# Patient Record
Sex: Female | Born: 1995 | Race: Black or African American | Hispanic: No | Marital: Single | Smoking: Never smoker
Health system: Southern US, Community
[De-identification: ages and names within clinical notes are randomized; demographics above are authoritative.]

## PROBLEM LIST (undated history)

## (undated) ENCOUNTER — Inpatient Hospital Stay (HOSPITAL_COMMUNITY): Payer: Self-pay

## (undated) DIAGNOSIS — F209 Schizophrenia, unspecified: Secondary | ICD-10-CM

## (undated) DIAGNOSIS — Z789 Other specified health status: Secondary | ICD-10-CM

## (undated) HISTORY — PX: TONSILLECTOMY: SUR1361

## (undated) HISTORY — PX: ADENOIDECTOMY: SUR15

---
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
## 2015-07-30 ENCOUNTER — Encounter (HOSPITAL_COMMUNITY): Payer: Self-pay | Admitting: *Deleted

## 2015-07-30 ENCOUNTER — Emergency Department (HOSPITAL_COMMUNITY)
Admission: EM | Admit: 2015-07-30 | Discharge: 2015-07-31 | Disposition: A | Discharge status: Home / Self Care | Payer: Medicaid Other | Attending: Emergency Medicine | Admitting: Emergency Medicine

## 2015-07-30 DIAGNOSIS — R630 Anorexia: Secondary | ICD-10-CM | POA: Insufficient documentation

## 2015-07-30 DIAGNOSIS — N760 Acute vaginitis: Secondary | ICD-10-CM | POA: Diagnosis not present

## 2015-07-30 DIAGNOSIS — F17200 Nicotine dependence, unspecified, uncomplicated: Secondary | ICD-10-CM | POA: Diagnosis not present

## 2015-07-30 DIAGNOSIS — J020 Streptococcal pharyngitis: Secondary | ICD-10-CM | POA: Diagnosis not present

## 2015-07-30 DIAGNOSIS — Z331 Pregnant state, incidental: Secondary | ICD-10-CM | POA: Diagnosis not present

## 2015-07-30 DIAGNOSIS — B9689 Other specified bacterial agents as the cause of diseases classified elsewhere: Secondary | ICD-10-CM

## 2015-07-30 DIAGNOSIS — Z3490 Encounter for supervision of normal pregnancy, unspecified, unspecified trimester: Secondary | ICD-10-CM

## 2015-07-30 DIAGNOSIS — R109 Unspecified abdominal pain: Secondary | ICD-10-CM | POA: Diagnosis present

## 2015-07-30 MED ORDER — SODIUM CHLORIDE 0.9 % IV BOLUS (SEPSIS)
1000.0000 mL | Freq: Once | INTRAVENOUS | Status: AC
  Administered 2015-07-31: 1000 mL via INTRAVENOUS

## 2015-07-30 MED ORDER — ONDANSETRON HCL 4 MG/2ML IJ SOLN
4.0000 mg | Freq: Once | INTRAMUSCULAR | Status: AC
  Administered 2015-07-31: 4 mg via INTRAVENOUS
  Filled 2015-07-30: qty 2

## 2015-07-30 NOTE — ED Notes (Signed)
EDP at bedside  

## 2015-07-30 NOTE — ED Provider Notes (Signed)
CSN: [id number]     Arrival date & time 07/30/15  2322 History  By signing my name below, I, Stacey Guzman, attest that this documentation has been prepared under the direction and in the presence of Stacey Pel, PA-C. Electronically Signed: Ronney Guzman, ED Scribe. 07/30/2015. 12:00 AM.    Chief Complaint  Patient presents with  . Abdominal Pain  . Nausea   The history is provided by the patient. No language interpreter was used.    HPI Comments: Stacey Guzman is a 20 y.o. female with no pertinent PMHx, who presents to the Emergency Department complaining of 7/10 abdominal pain that began 3 days ago. She also notes an associated sore throat, cough, loss of appetite, 3-4 associated episodes of vomiting per day every other day, and one episode of diarrhea that occurred 3 days ago. Patient notes she has increased marijuana usage in the past couple of weeks to help increase her appetite. She then started smoking less marijuana and now feels "empty inside" and decreased appetite.. Her pain changes location from epigastric to LLQ. She denies hematemesis, diarrhea, vaginal discharge, vaginal bleeding, back pain, fevers, lethargy, weakness, headaches, joint pains, myalgias.  History reviewed. No pertinent past medical history. History reviewed. No pertinent past surgical history. No family history on file. Social History  Substance Use Topics  . Smoking status: Light Tobacco Smoker  . Smokeless tobacco: None  . Alcohol Use: Yes   OB History    No data available     Review of Systems A complete 10 system review of systems was obtained and all systems are negative except as noted in the HPI and PMH.    Allergies  Review of patient's allergies indicates no known allergies.  Home Medications   Prior to Admission medications   Medication Sig Start Date End Date Taking? Authorizing Provider  Pseudoeph-Doxylamine-DM-APAP (NYQUIL PO) Take 30 mLs by mouth at bedtime as needed (cold/flu).   Yes  Historical Provider, MD  amoxicillin (AMOXIL) 500 MG capsule Take 1 capsule (500 mg total) by mouth 3 (three) times daily. 07/31/15   Kit Mollett Neva Seat, PA-C  metroNIDAZOLE (FLAGYL) 500 MG tablet Take 1 tablet (500 mg total) by mouth 2 (two) times daily. 07/31/15   Stacey Pel, PA-C  Prenatal Vit-Fe Fumarate-FA (PRENATAL COMPLETE) 14-0.4 MG TABS Take 1 tablet by mouth 2 (two) times daily. 07/31/15   Aaden Buckman Neva Seat, PA-C   BP 112/69 mmHg  Pulse 100  Temp(Src) 98.3 F (36.8 C) (Oral)  Resp 13  SpO2 99%  LMP 07/20/2015 Physical Exam  Constitutional: She is oriented to person, place, and time. She appears well-developed and well-nourished. No distress.  HENT:  Head: Normocephalic and atraumatic.  Right Ear: Tympanic membrane and ear canal normal.  Left Ear: Tympanic membrane and ear canal normal.  Nose: Nose normal.  Mouth/Throat: Uvula is midline and mucous membranes are normal. No trismus in the jaw. Posterior oropharyngeal erythema present. No oropharyngeal exudate.  Eyes: Conjunctivae and EOM are normal. Pupils are equal, round, and reactive to light.  Neck: Normal range of motion. Neck supple. No tracheal deviation present.  Cardiovascular: Normal rate and regular rhythm.   Pulmonary/Chest: Effort normal. No respiratory distress.  Abdominal: Soft. There is no rebound, no guarding and no CVA tenderness.  No signs of abdominal distention Mild migratory abdominal pains.  Genitourinary:  Clear discharge and mild spotting within vaginal vault. Cervical os is closed.  Musculoskeletal: Normal range of motion.  No LE swelling  Neurological: She is alert and oriented to  person, place, and time.  Acting at baseline  Skin: Skin is warm and dry. No rash noted.  Psychiatric: She has a normal mood and affect. Her behavior is normal.  Nursing note and vitals reviewed.   ED Course  Procedures (including critical care time)  DIAGNOSTIC STUDIES: Oxygen Saturation is 99% on RA, normal by my  interpretation.    COORDINATION OF CARE: 11:57 PM - Discussed treatment plan with pt at bedside which includes diagnostic lab tests. Pt verbalized understanding and agreed to plan.   Labs Review Labs Reviewed  RAPID STREP SCREEN (NOT [facility]) - Abnormal; Notable for the following:    Streptococcus, Group A Screen (Direct) POSITIVE (*)    All other components within normal limits  WET PREP, GENITAL - Abnormal; Notable for the following:    Clue Cells Wet Prep HPF POC PRESENT (*)    All other components within normal limits  URINALYSIS, ROUTINE W REFLEX MICROSCOPIC (NOT AT [facility]) - Abnormal; Notable for the following:    Color, Urine AMBER (*)    APPearance CLOUDY (*)    Hgb urine dipstick MODERATE (*)    Bilirubin Urine SMALL (*)    Ketones, ur 15 (*)    Protein, ur 30 (*)    All other components within normal limits  COMPREHENSIVE METABOLIC PANEL - Abnormal; Notable for the following:    Glucose, Bld 100 (*)    All other components within normal limits  CBC WITH DIFFERENTIAL/PLATELET - Abnormal; Notable for the following:    WBC 13.3 (*)    Neutro Abs 10.5 (*)    All other components within normal limits  URINE MICROSCOPIC-ADD ON - Abnormal; Notable for the following:    Squamous Epithelial / LPF 6-30 (*)    Bacteria, UA RARE (*)    All other components within normal limits  POC URINE PREG, ED - Abnormal; Notable for the following:    Preg Test, Ur POSITIVE (*)    All other components within normal limits  LIPASE, BLOOD  GC/CHLAMYDIA PROBE AMP ([place]) NOT AT [facility]    Imaging Review No results found. I have personally reviewed and evaluated these images and lab results as part of my medical decision-making.   EKG Interpretation None      MDM   Final diagnoses:  Strep throat  Pregnant  BV (bacterial vaginosis)    Patient has positive strep screen and positive Pregnancy test. Otherwise her labs are not acutely abnormal. Wet prep shows positive for BV. GC is  pending.  Rx: Amoxicillin, Flagyl and prenatal vitamins. Patient to follow-up with [facility] outpatient clinic.  Medications  sodium chloride 0.9 % bolus 1,000 mL (0 mLs Intravenous Stopped 07/31/15 0151)  ondansetron (ZOFRAN) injection 4 mg (4 mg Intravenous Given 07/31/15 0009)     I feel the patient has had an appropriate workup for their chief complaint at this time and likelihood of emergent condition existing is low. Discussed s/sx that warrant return to the ED.  Filed Vitals:   07/30/15 2326  BP: 112/69  Pulse: 100  Temp: 98.3 F (36.8 C)  Resp: 13    I personally performed the services described in this documentation, which was scribed in my presence. The recorded information has been reviewed and is accurate.     Stacey Peliffany Shaana Acocella, PA-C 07/31/15 0155  Arby BarretteMarcy Pfeiffer, MD 08/08/15 [phone]

## 2015-07-30 NOTE — ED Notes (Signed)
Pt states she has abdominal pain with nausea and vomiting and constipation for 3 days,  7/10

## 2015-07-31 ENCOUNTER — Emergency Department (HOSPITAL_COMMUNITY): Payer: Medicaid Other

## 2015-07-31 LAB — COMPREHENSIVE METABOLIC PANEL
ALBUMIN: 4.3 g/dL (ref 3.5–5.0)
ALT: 14 U/L (ref 14–54)
AST: 22 U/L (ref 15–41)
Alkaline Phosphatase: 49 U/L (ref 38–126)
Anion gap: 10 (ref 5–15)
BUN: 8 mg/dL (ref 6–20)
CHLORIDE: 104 mmol/L (ref 101–111)
CO2: 23 mmol/L (ref 22–32)
Calcium: 8.9 mg/dL (ref 8.9–10.3)
Creatinine, Ser: 0.73 mg/dL (ref 0.44–1.00)
GFR calc Af Amer: >60 mL/min (ref >60)
GLUCOSE: 100 mg/dL — AB (ref 65–99)
POTASSIUM: 3.9 mmol/L (ref 3.5–5.1)
SODIUM: 137 mmol/L (ref 135–145)
Total Bilirubin: 0.9 mg/dL (ref 0.3–1.2)
Total Protein: 7.9 g/dL (ref 6.5–8.1)

## 2015-07-31 LAB — URINALYSIS, ROUTINE W REFLEX MICROSCOPIC
Glucose, UA: NEGATIVE mg/dL
Ketones, ur: 15 mg/dL — AB
LEUKOCYTES UA: NEGATIVE
Nitrite: NEGATIVE
PROTEIN: 30 mg/dL — AB
SPECIFIC GRAVITY, URINE: 1.027 (ref 1.005–1.030)
pH: 6 (ref 5.0–8.0)

## 2015-07-31 LAB — CBC WITH DIFFERENTIAL/PLATELET
BASOS ABS: 0 10*3/uL (ref 0.0–0.1)
Basophils Relative: 0 %
Eosinophils Absolute: 0 10*3/uL (ref 0.0–0.7)
Eosinophils Relative: 0 %
HEMATOCRIT: 37.4 % (ref 36.0–46.0)
Hemoglobin: 12.7 g/dL (ref 12.0–15.0)
LYMPHS PCT: 13 %
Lymphs Abs: 1.7 10*3/uL (ref 0.7–4.0)
MCH: 29.3 pg (ref 26.0–34.0)
MCHC: 34 g/dL (ref 30.0–36.0)
MCV: 86.4 fL (ref 78.0–100.0)
MONO ABS: 1 10*3/uL (ref 0.1–1.0)
Monocytes Relative: 8 %
NEUTROS ABS: 10.5 10*3/uL — AB (ref 1.7–7.7)
Neutrophils Relative %: 79 %
Platelets: 281 10*3/uL (ref 150–400)
RBC: 4.33 MIL/uL (ref 3.87–5.11)
RDW: 13.8 % (ref 11.5–15.5)
WBC: 13.3 10*3/uL — ABNORMAL HIGH (ref 4.0–10.5)

## 2015-07-31 LAB — URINE MICROSCOPIC-ADD ON

## 2015-07-31 LAB — POC URINE PREG, ED: PREG TEST UR: POSITIVE — AB

## 2015-07-31 LAB — WET PREP, GENITAL
SPERM: NONE SEEN
TRICH WET PREP: NONE SEEN
WBC, Wet Prep HPF POC: NONE SEEN
Yeast Wet Prep HPF POC: NONE SEEN

## 2015-07-31 LAB — RAPID STREP SCREEN (MED CTR MEBANE ONLY): Streptococcus, Group A Screen (Direct): POSITIVE — AB

## 2015-07-31 LAB — LIPASE, BLOOD: LIPASE: 21 U/L (ref 11–51)

## 2015-07-31 MED ORDER — AMOXICILLIN 500 MG PO CAPS: 500.0000 mg | ORAL_CAPSULE | Freq: Three times a day (TID) | ORAL | Status: DC

## 2015-07-31 MED ORDER — PRENATAL COMPLETE 14-0.4 MG PO TABS: 1.0000 | ORAL_TABLET | Freq: Two times a day (BID) | ORAL | Status: DC

## 2015-07-31 MED ORDER — METRONIDAZOLE 500 MG PO TABS: 500.0000 mg | ORAL_TABLET | Freq: Two times a day (BID) | ORAL | Status: DC

## 2015-07-31 NOTE — Discharge Instructions (Signed)
Eating Plan for Pregnant Women °While you are pregnant, your body will require additional nutrition to help support your growing baby. It is recommended that you consume: °· 150 additional calories each day during your first trimester. °· 300 additional calories each day during your second trimester. °· 300 additional calories each day during your third trimester. °Eating a healthy, well-balanced diet is very important for your health and for your baby's health. You also have a higher need for some vitamins and minerals, such as folic acid, calcium, iron, and vitamin D. °WHAT DO I NEED TO KNOW ABOUT EATING DURING PREGNANCY? °· Do not try to lose weight or go on a diet during pregnancy. °· Choose healthy, nutritious foods. Choose ½ of a sandwich with a glass of milk instead of a candy bar or a high-calorie sugar-sweetened beverage. °· Limit your overall intake of foods that have "empty calories." These are foods that have little nutritional value, such as sweets, desserts, candies, sugar-sweetened beverages, and fried foods. °· Eat a variety of foods, especially fruits and vegetables. °· Take a prenatal vitamin to help meet the additional needs during pregnancy, specifically for folic acid, iron, calcium, and vitamin D. °· Remember to stay active. Ask your health care provider for exercise recommendations that are specific to you. °· Practice good food safety and cleanliness, such as washing your hands before you eat and after you prepare raw meat. This helps to prevent foodborne illnesses, such as listeriosis, that can be very dangerous for your baby. Ask your health care provider for more information about listeriosis. °WHAT DOES 150 EXTRA CALORIES LOOK LIKE? °Healthy options for an additional 150 calories each day could be any of the following: °· Plain low-fat yogurt (6-8 oz) with ½ cup of berries. °· 1 apple with 2 teaspoons of peanut butter. °· Cut-up vegetables with ¼ cup of hummus. °· Low-fat chocolate milk  (8 oz or 1 cup). °· 1 string cheese with 1 medium orange. °· ½ of a peanut butter and jelly sandwich on whole-wheat bread (1 tsp of peanut butter). °For 300 calories, you could eat two of those healthy options each day.  °WHAT IS A HEALTHY AMOUNT OF WEIGHT TO GAIN? °The recommended amount of weight for you to gain is based on your pre-pregnancy BMI. If your pre-pregnancy BMI was: °· Less than 18 (underweight), you should gain 28-40 lb. °· 18-24.9 (normal), you should gain 25-35 lb. °· 25-29.9 (overweight), you should gain 15-25 lb. °· Greater than 30 (obese), you should gain 11-20 lb. °WHAT IF I AM HAVING TWINS OR MULTIPLES? °Generally, pregnant women who will be having twins or multiples may need to increase their daily calories by 300-600 calories each day. The recommended range for total weight gain is 25-54 lb, depending on your pre-pregnancy BMI. Talk with your health care provider for specific guidance about additional nutritional needs, weight gain, and exercise during your pregnancy. °WHAT FOODS CAN I EAT? °Grains °Any grains. Try to choose whole grains, such as whole-wheat bread, oatmeal, or brown rice. °Vegetables °Any vegetables. Try to eat a variety of colors and types of vegetables to get a full range of vitamins and minerals. Remember to wash your vegetables well before eating. °Fruits °Any fruits. Try to eat a variety of colors and types of fruit to get a full range of vitamins and minerals. Remember to wash your fruits well before eating. °Meats and Other Protein Sources °Lean meats, including chicken, turkey, fish, and lean cuts of beef, veal, or pork.   Make sure that all meats are cooked to "well done." Tofu. Tempeh. Beans. Eggs. Peanut butter and other nut butters. Seafood, such as shrimp, crab, and lobster. If you choose fish, select types that are higher in omega-3 fatty acids, including salmon, herring, mussels, trout, sardines, and pollock. Make sure that all meats are cooked to food-safe  temperatures. Dairy Pasteurized milk and milk alternatives. Pasteurized yogurt and pasteurized cheese. Cottage cheese. Sour cream. Beverages Water. Juices that contain 100% fruit juice or vegetable juice. Caffeine-free teas and decaffeinated coffee. Drinks that contain caffeine are okay to drink, but it is better to avoid caffeine. Keep your total caffeine intake to less than 200 mg each day (12 oz of coffee, tea, or soda) or as directed by your health care provider. Condiments Any pasteurized condiments. Sweets and Desserts Any sweets and desserts. Fats and Oils Any fats and oils. The items listed above may not be a complete list of recommended foods or beverages. Contact your dietitian for more options. WHAT FOODS ARE NOT RECOMMENDED? Vegetables Unpasteurized (raw) vegetable juices. Fruits Unpasteurized (raw) fruit juices. Meats and Other Protein Sources Cured meats that have nitrates, such as bacon, salami, and hotdogs. Luncheon meats, bologna, or other deli meats (unless they are reheated until they are steaming hot). Refrigerated pate, meat spreads from a meat counter, smoked seafood that is found in the refrigerated section of a store. Raw fish, such as sushi or sashimi. High mercury content fish, such as tilefish, shark, swordfish, and king mackerel. Raw meats, such as tuna or beef tartare. Undercooked meats and poultry. Make sure that all meats are cooked to food-safe temperatures. Dairy Unpasteurized (raw) milk and any foods that have raw milk in them. Soft cheeses, such as feta, queso blanco, queso fresco, Brie, Camembert cheeses, blue-veined cheeses, and Panela cheese (unless it is made with pasteurized milk, which must be stated on the label). Beverages Alcohol. Sugar-sweetened beverages, such as sodas, teas, or energy drinks. Condiments Homemade fermented foods and drinks, such as pickles, sauerkraut, or kombucha drinks. (Store-bought pasteurized versions of these are  okay.) Other Salads that are made in the store, such as ham salad, chicken salad, egg salad, tuna salad, and seafood salad. The items listed above may not be a complete list of foods and beverages to avoid. Contact your dietitian for more information.   This information is not intended to replace advice given to you by your health care provider. Make sure you discuss any questions you have with your health care provider.   Document Released: 02/26/2014 Document Reviewed: 02/26/2014 Elsevier Interactive Patient Education [employer]  First Trimester of Pregnancy The first trimester of pregnancy is from week 1 until the end of week 12 (months 1 through 3). A week after a sperm fertilizes an egg, the egg will implant on the wall of the uterus. This embryo will begin to develop into a baby. Genes from you and your partner are forming the baby. The female genes determine whether the baby is a boy or a girl. At 6-8 weeks, the eyes and face are formed, and the heartbeat can be seen on ultrasound. At the end of 12 weeks, all the baby's organs are formed.  Now that you are pregnant, you will want to do everything you can to have a healthy baby. Two of the most important things are to get good prenatal care and to follow your health care provider's instructions. Prenatal care is all the medical care you receive before the baby's birth. This  care will help prevent, find, and treat any problems during the pregnancy and childbirth. BODY CHANGES Your body goes through many changes during pregnancy. The changes vary from woman to woman.   You may gain or lose a couple of pounds at first.  You may feel sick to your stomach (nauseous) and throw up (vomit). If the vomiting is uncontrollable, call your health care provider.  You may tire easily.  You may develop headaches that can be relieved by medicines approved by your health care provider.  You may urinate more often. Painful urination may mean you  have a bladder infection.  You may develop heartburn as a result of your pregnancy.  You may develop constipation because certain hormones are causing the muscles that push waste through your intestines to slow down.  You may develop hemorrhoids or swollen, bulging veins (varicose veins).  Your breasts may begin to grow larger and become tender. Your nipples may stick out more, and the tissue that surrounds them (areola) may become darker.  Your gums may bleed and may be sensitive to brushing and flossing.  Dark spots or blotches (chloasma, mask of pregnancy) may develop on your face. This will likely fade after the baby is born.  Your menstrual periods will stop.  You may have a loss of appetite.  You may develop cravings for certain kinds of food.  You may have changes in your emotions from day to day, such as being excited to be pregnant or being concerned that something may go wrong with the pregnancy and baby.  You may have more vivid and strange dreams.  You may have changes in your hair. These can include thickening of your hair, rapid growth, and changes in texture. Some women also have hair loss during or after pregnancy, or hair that feels dry or thin. Your hair will most likely return to normal after your baby is born. WHAT TO EXPECT AT YOUR PRENATAL VISITS During a routine prenatal visit:  You will be weighed to make sure you and the baby are growing normally.  Your blood pressure will be taken.  Your abdomen will be measured to track your baby's growth.  The fetal heartbeat will be listened to starting around week 10 or 12 of your pregnancy.  Test results from any previous visits will be discussed. Your health care provider may ask you:  How you are feeling.  If you are feeling the baby move.  If you have had any abnormal symptoms, such as leaking fluid, bleeding, severe headaches, or abdominal cramping.  If you are using any tobacco products, including  cigarettes, chewing tobacco, and electronic cigarettes.  If you have any questions. Other tests that may be performed during your first trimester include:  Blood tests to find your blood type and to check for the presence of any previous infections. They will also be used to check for low iron levels (anemia) and Rh antibodies. Later in the pregnancy, blood tests for diabetes will be done along with other tests if problems develop.  Urine tests to check for infections, diabetes, or protein in the urine.  An ultrasound to confirm the proper growth and development of the baby.  An amniocentesis to check for possible genetic problems.  Fetal screens for spina bifida and Down syndrome.  You may need other tests to make sure you and the baby are doing well.  HIV (human immunodeficiency virus) testing. Routine prenatal testing includes screening for HIV, unless you choose not to have  this test. HOME CARE INSTRUCTIONS  Medicines  Follow your health care provider's instructions regarding medicine use. Specific medicines may be either safe or unsafe to take during pregnancy.  Take your prenatal vitamins as directed.  If you develop constipation, try taking a stool softener if your health care provider approves. Diet  Eat regular, well-balanced meals. Choose a variety of foods, such as meat or vegetable-based protein, fish, milk and low-fat dairy products, vegetables, fruits, and whole grain breads and cereals. Your health care provider will help you determine the amount of weight gain that is right for you.  Avoid raw meat and uncooked cheese. These carry germs that can cause birth defects in the baby.  Eating four or five small meals rather than three large meals a day may help relieve nausea and vomiting. If you start to feel nauseous, eating a few soda crackers can be helpful. Drinking liquids between meals instead of during meals also seems to help nausea and vomiting.  If you develop  constipation, eat more high-fiber foods, such as fresh vegetables or fruit and whole grains. Drink enough fluids to keep your urine clear or pale yellow. Activity and Exercise  Exercise only as directed by your health care provider. Exercising will help you:  Control your weight.  Stay in shape.  Be prepared for labor and delivery.  Experiencing pain or cramping in the lower abdomen or low back is a good sign that you should stop exercising. Check with your health care provider before continuing normal exercises.  Try to avoid standing for long periods of time. Move your legs often if you must stand in one place for a long time.  Avoid heavy lifting.  Wear low-heeled shoes, and practice good posture.  You may continue to have sex unless your health care provider directs you otherwise. Relief of Pain or Discomfort  Wear a good support bra for breast tenderness.   Take warm sitz baths to soothe any pain or discomfort caused by hemorrhoids. Use hemorrhoid cream if your health care provider approves.   Rest with your legs elevated if you have leg cramps or low back pain.  If you develop varicose veins in your legs, wear support hose. Elevate your feet for 15 minutes, 3-4 times a day. Limit salt in your diet. Prenatal Care  Schedule your prenatal visits by the twelfth week of pregnancy. They are usually scheduled monthly at first, then more often in the last 2 months before delivery.  Write down your questions. Take them to your prenatal visits.  Keep all your prenatal visits as directed by your health care provider. Safety  Wear your seat belt at all times when driving.  Make a list of emergency phone numbers, including numbers for family, friends, the hospital, and police and fire departments. General Tips  Ask your health care provider for a referral to a local prenatal education class. Begin classes no later than at the beginning of month 6 of your pregnancy.  Ask for  help if you have counseling or nutritional needs during pregnancy. Your health care provider can offer advice or refer you to specialists for help with various needs.  Do not use hot tubs, steam rooms, or saunas.  Do not douche or use tampons or scented sanitary pads.  Do not cross your legs for long periods of time.  Avoid cat litter boxes and soil used by cats. These carry germs that can cause birth defects in the baby and possibly loss of the fetus  by miscarriage or stillbirth.  Avoid all smoking, herbs, alcohol, and medicines not prescribed by your health care provider. Chemicals in these affect the formation and growth of the baby.  Do not use any tobacco products, including cigarettes, chewing tobacco, and electronic cigarettes. If you need help quitting, ask your health care provider. You may receive counseling support and other resources to help you quit.  Schedule a dentist appointment. At home, brush your teeth with a soft toothbrush and be gentle when you floss. SEEK MEDICAL CARE IF:   You have dizziness.  You have mild pelvic cramps, pelvic pressure, or nagging pain in the abdominal area.  You have persistent nausea, vomiting, or diarrhea.  You have a bad smelling vaginal discharge.  You have pain with urination.  You notice increased swelling in your face, hands, legs, or ankles. SEEK IMMEDIATE MEDICAL CARE IF:   You have a fever.  You are leaking fluid from your vagina.  You have spotting or bleeding from your vagina.  You have severe abdominal cramping or pain.  You have rapid weight gain or loss.  You vomit blood or material that looks like coffee grounds.  You are exposed to [place] measles and have never had them.  You are exposed to fifth disease or chickenpox.  You develop a severe headache.  You have shortness of breath.  You have any kind of trauma, such as from a fall or a car accident.   This information is not intended to replace advice  given to you by your health care provider. Make sure you discuss any questions you have with your health care provider.   Document Released: 05/08/2001 Document Revised: 06/04/2014 Document Reviewed: 03/24/2013 Elsevier Interactive Patient Education 2016 [employer] Strep Throat Strep throat is a bacterial infection of the throat. Your health care provider may call the infection tonsillitis or pharyngitis, depending on whether there is swelling in the tonsils or at the back of the throat. Strep throat is most common during the cold months of the year in children who are 72-86 years of age, but it can happen during any season in people of any age. This infection is spread from person to person (contagious) through coughing, sneezing, or close contact. CAUSES Strep throat is caused by the bacteria called Streptococcus pyogenes. RISK FACTORS This condition is more likely to develop in:  People who spend time in crowded places where the infection can spread easily.  People who have close contact with someone who has strep throat. SYMPTOMS Symptoms of this condition include:  Fever or chills.   Redness, swelling, or pain in the tonsils or throat.  Pain or difficulty when swallowing.  White or yellow spots on the tonsils or throat.  Swollen, tender glands in the neck or under the jaw.  Red rash all over the body (rare). DIAGNOSIS This condition is diagnosed by performing a rapid strep test or by taking a swab of your throat (throat culture test). Results from a rapid strep test are usually ready in a few minutes, but throat culture test results are available after one or two days. TREATMENT This condition is treated with antibiotic medicine. HOME CARE INSTRUCTIONS Medicines  Take over-the-counter and prescription medicines only as told by your health care provider.  Take your antibiotic as told by your health care provider. Do not stop taking the antibiotic even if you start to feel  better.  Have family members who also have a sore throat or fever tested for strep throat.  They may need antibiotics if they have the strep infection. Eating and Drinking  Do not share food, drinking cups, or personal items that could cause the infection to spread to other people.  If swallowing is difficult, try eating soft foods until your sore throat feels better.  Drink enough fluid to keep your urine clear or pale yellow. General Instructions  Gargle with a salt-water mixture 3-4 times per day or as needed. To make a salt-water mixture, completely dissolve -1 tsp of salt in 1 cup of warm water.  Make sure that all household members wash their hands well.  Get plenty of rest.  Stay home from school or work until you have been taking antibiotics for 24 hours.  Keep all follow-up visits as told by your health care provider. This is important. SEEK MEDICAL CARE IF:  The glands in your neck continue to get bigger.  You develop a rash, cough, or earache.  You cough up a thick liquid that is green, yellow-brown, or bloody.  You have pain or discomfort that does not get better with medicine.  Your problems seem to be getting worse rather than better.  You have a fever. SEEK IMMEDIATE MEDICAL CARE IF:  You have new symptoms, such as vomiting, severe headache, stiff or painful neck, chest pain, or shortness of breath.  You have severe throat pain, drooling, or changes in your voice.  You have swelling of the neck, or the skin on the neck becomes red and tender.  You have signs of dehydration, such as fatigue, dry mouth, and decreased urination.  You become increasingly sleepy, or you cannot wake up completely.  Your joints become red or painful.   This information is not intended to replace advice given to you by your health care provider. Make sure you discuss any questions you have with your health care provider.   Document Released: 05/11/2000 Document Revised:  02/02/2015 Document Reviewed: 09/06/2014 Elsevier Interactive Patient Education 2016 [employer]  Bacterial Vaginosis Bacterial vaginosis is an infection of the vagina. It happens when too many germs (bacteria) grow in the vagina. Having this infection puts you at risk for getting other infections from sex. Treating this infection can help lower your risk for other infections, such as:   Chlamydia.  Gonorrhea.  HIV.  Herpes. HOME CARE  Take your medicine as told by your doctor.  Finish your medicine even if you start to feel better.  Tell your sex partner that you have an infection. They should see their doctor for treatment.  During treatment:  Avoid sex or use condoms correctly.  Do not douche.  Do not drink alcohol unless your doctor tells you it is ok.  Do not breastfeed unless your doctor tells you it is ok. GET HELP IF:  You are not getting better after 3 days of treatment.  You have more grey fluid (discharge) coming from your vagina than before.  You have more pain than before.  You have a fever. MAKE SURE YOU:   Understand these instructions.  Will watch your condition.  Will get help right away if you are not doing well or get worse.   This information is not intended to replace advice given to you by your health care provider. Make sure you discuss any questions you have with your health care provider.   Document Released: 02/21/2008 Document Revised: 06/04/2014 Document Reviewed: 12/24/2012 Elsevier Interactive Patient Education [employer]

## 2015-08-01 LAB — GC/CHLAMYDIA PROBE AMP (~~LOC~~) NOT AT ARMC
Chlamydia: NEGATIVE
Neisseria Gonorrhea: NEGATIVE

## 2015-08-04 ENCOUNTER — Encounter (HOSPITAL_COMMUNITY): Payer: Self-pay

## 2015-08-04 ENCOUNTER — Emergency Department (HOSPITAL_COMMUNITY): Payer: Medicaid Other

## 2015-08-04 ENCOUNTER — Emergency Department (HOSPITAL_COMMUNITY)
Admission: EM | Admit: 2015-08-04 | Discharge: 2015-08-04 | Disposition: A | Discharge status: Home / Self Care | Payer: Medicaid Other | Attending: Emergency Medicine | Admitting: Emergency Medicine

## 2015-08-04 DIAGNOSIS — R109 Unspecified abdominal pain: Secondary | ICD-10-CM

## 2015-08-04 DIAGNOSIS — O99611 Diseases of the digestive system complicating pregnancy, first trimester: Secondary | ICD-10-CM | POA: Diagnosis not present

## 2015-08-04 DIAGNOSIS — Z3A01 Less than 8 weeks gestation of pregnancy: Secondary | ICD-10-CM | POA: Insufficient documentation

## 2015-08-04 DIAGNOSIS — K5900 Constipation, unspecified: Secondary | ICD-10-CM | POA: Diagnosis not present

## 2015-08-04 DIAGNOSIS — F17200 Nicotine dependence, unspecified, uncomplicated: Secondary | ICD-10-CM | POA: Diagnosis not present

## 2015-08-04 DIAGNOSIS — O99331 Smoking (tobacco) complicating pregnancy, first trimester: Secondary | ICD-10-CM | POA: Insufficient documentation

## 2015-08-04 DIAGNOSIS — Z792 Long term (current) use of antibiotics: Secondary | ICD-10-CM | POA: Diagnosis not present

## 2015-08-04 DIAGNOSIS — O9989 Other specified diseases and conditions complicating pregnancy, childbirth and the puerperium: Secondary | ICD-10-CM | POA: Diagnosis present

## 2015-08-04 DIAGNOSIS — O209 Hemorrhage in early pregnancy, unspecified: Secondary | ICD-10-CM | POA: Insufficient documentation

## 2015-08-04 DIAGNOSIS — R102 Pelvic and perineal pain: Secondary | ICD-10-CM

## 2015-08-04 DIAGNOSIS — Z79899 Other long term (current) drug therapy: Secondary | ICD-10-CM | POA: Diagnosis not present

## 2015-08-04 DIAGNOSIS — O26899 Other specified pregnancy related conditions, unspecified trimester: Secondary | ICD-10-CM

## 2015-08-04 LAB — COMPREHENSIVE METABOLIC PANEL
ALT: 34 U/L (ref 14–54)
AST: 36 U/L (ref 15–41)
Albumin: 4.5 g/dL (ref 3.5–5.0)
Alkaline Phosphatase: 48 U/L (ref 38–126)
Anion gap: 11 (ref 5–15)
BUN: 12 mg/dL (ref 6–20)
CHLORIDE: 102 mmol/L (ref 101–111)
CO2: 20 mmol/L — AB (ref 22–32)
CREATININE: 0.71 mg/dL (ref 0.44–1.00)
Calcium: 9.3 mg/dL (ref 8.9–10.3)
GFR calc non Af Amer: >60 mL/min (ref >60)
Glucose, Bld: 82 mg/dL (ref 65–99)
POTASSIUM: 4.6 mmol/L (ref 3.5–5.1)
SODIUM: 133 mmol/L — AB (ref 135–145)
Total Bilirubin: 1.4 mg/dL — ABNORMAL HIGH (ref 0.3–1.2)
Total Protein: 8.3 g/dL — ABNORMAL HIGH (ref 6.5–8.1)

## 2015-08-04 LAB — URINALYSIS, ROUTINE W REFLEX MICROSCOPIC
BILIRUBIN URINE: NEGATIVE
GLUCOSE, UA: NEGATIVE mg/dL
Hgb urine dipstick: NEGATIVE
Leukocytes, UA: NEGATIVE
Nitrite: NEGATIVE
PH: 6 (ref 5.0–8.0)
PROTEIN: 30 mg/dL — AB
Specific Gravity, Urine: 1.037 — ABNORMAL HIGH (ref 1.005–1.030)

## 2015-08-04 LAB — URINE MICROSCOPIC-ADD ON

## 2015-08-04 LAB — ABO/RH: ABO/RH(D): O POS

## 2015-08-04 LAB — LIPASE, BLOOD: LIPASE: 24 U/L (ref 11–51)

## 2015-08-04 LAB — HCG, QUANTITATIVE, PREGNANCY: hCG, Beta Chain, Quant, S: 35429 m[IU]/mL — ABNORMAL HIGH (ref <5)

## 2015-08-04 LAB — CBC
HEMATOCRIT: 43.3 % (ref 36.0–46.0)
Hemoglobin: 14.9 g/dL (ref 12.0–15.0)
MCH: 29.4 pg (ref 26.0–34.0)
MCHC: 34.4 g/dL (ref 30.0–36.0)
MCV: 85.4 fL (ref 78.0–100.0)
PLATELETS: 316 10*3/uL (ref 150–400)
RBC: 5.07 MIL/uL (ref 3.87–5.11)
RDW: 13.4 % (ref 11.5–15.5)
WBC: 12.3 10*3/uL — ABNORMAL HIGH (ref 4.0–10.5)

## 2015-08-04 LAB — I-STAT BETA HCG BLOOD, ED (MC, WL, AP ONLY): I-stat hCG, quantitative: >2000 m[IU]/mL — ABNORMAL HIGH (ref <5)

## 2015-08-04 MED ORDER — METRONIDAZOLE 0.75 % VA GEL: 1.0000 | Freq: Two times a day (BID) | VAGINAL | Status: DC

## 2015-08-04 MED ORDER — FLEET ENEMA 7-19 GM/118ML RE ENEM: 1.0000 | ENEMA | Freq: Once | RECTAL | Status: DC

## 2015-08-04 MED ORDER — PENICILLIN G BENZATHINE 1200000 UNIT/2ML IM SUSP
1.2000 10*6.[IU] | Freq: Once | INTRAMUSCULAR | Status: AC
  Administered 2015-08-04: 1.2 10*6.[IU] via INTRAMUSCULAR
  Filled 2015-08-04: qty 2

## 2015-08-04 MED ORDER — ONDANSETRON 4 MG PO TBDP
4.0000 mg | ORAL_TABLET | Freq: Once | ORAL | Status: AC | PRN
  Administered 2015-08-04: 4 mg via ORAL
  Filled 2015-08-04: qty 1

## 2015-08-04 MED ORDER — POLYETHYLENE GLYCOL 3350 17 G PO PACK: 17.0000 g | PACK | Freq: Every day | ORAL | Status: DC

## 2015-08-04 MED ORDER — DOXYLAMINE-PYRIDOXINE 10-10 MG PO TBEC
2.0000 | DELAYED_RELEASE_TABLET | Freq: Every day | ORAL | Status: DC
Start: 2015-08-04 — End: 2016-02-21

## 2015-08-04 NOTE — Discharge Instructions (Signed)
Pelvic rest. Follow-up with OB/GYN for close recheck especially if continued to have spotting. Taking her Lasix and Fleet enema for constipation. Make sure to drink plenty of fluids, because you do look dehydrated. He is metronidazole gel to achieve bacterial vaginosis. Follow-up with your doctor and OB/GYN specialist soon as able   Constipation, Adult Constipation is when a person:  Poops (has a bowel movement) less than 3 times a week.  Has a hard time pooping.  Has poop that is dry, hard, or bigger than normal. HOME CARE   Eat foods with a lot of fiber in them. This includes fruits, vegetables, beans, and whole grains such as brown rice.  Avoid fatty foods and foods with a lot of sugar. This includes french fries, hamburgers, cookies, candy, and soda.  If you are not getting enough fiber from food, take products with added fiber in them (supplements).  Drink enough fluid to keep your pee (urine) clear or pale yellow.  Exercise on a regular basis, or as told by your doctor.  Go to the restroom when you feel like you need to poop. Do not hold it.  Only take medicine as told by your doctor. Do not take medicines that help you poop (laxatives) without talking to your doctor first. GET HELP RIGHT AWAY IF:   You have bright red blood in your poop (stool).  Your constipation lasts more than 4 days or gets worse.  You have belly (abdominal) or butt (rectal) pain.  You have thin poop (as thin as a pencil).  You lose weight, and it cannot be explained. MAKE SURE YOU:   Understand these instructions.  Will watch your condition.  Will get help right away if you are not doing well or get worse.   This information is not intended to replace advice given to you by your health care provider. Make sure you discuss any questions you have with your health care provider.   Document Released: 10/31/2007 Document Revised: 06/04/2014 Document Reviewed: 02/23/2013 Elsevier Interactive  Patient Education 2016 Elsevier Inc. Abdominal Pain During Pregnancy Abdominal pain is common in pregnancy. Most of the time, it does not cause harm. There are many causes of abdominal pain. Some causes are more serious than others. Some of the causes of abdominal pain in pregnancy are easily diagnosed. Occasionally, the diagnosis takes time to understand. Other times, the cause is not determined. Abdominal pain can be a sign that something is very wrong with the pregnancy, or the pain may have nothing to do with the pregnancy at all. For this reason, always tell your health care provider if you have any abdominal discomfort. HOME CARE INSTRUCTIONS  Monitor your abdominal pain for any changes. The following actions may help to alleviate any discomfort you are experiencing:  Do not have sexual intercourse or put anything in your vagina until your symptoms go away completely.  Get plenty of rest until your pain improves.  Drink clear fluids if you feel nauseous. Avoid solid food as long as you are uncomfortable or nauseous.  Only take over-the-counter or prescription medicine as directed by your health care provider.  Keep all follow-up appointments with your health care provider. SEEK IMMEDIATE MEDICAL CARE IF:  You are bleeding, leaking fluid, or passing tissue from the vagina.  You have increasing pain or cramping.  You have persistent vomiting.  You have painful or bloody urination.  You have a fever.  You notice a decrease in your baby's movements.  You have extreme  weakness or feel faint.  You have shortness of breath, with or without abdominal pain.  You develop a severe headache with abdominal pain.  You have abnormal vaginal discharge with abdominal pain.  You have persistent diarrhea.  You have abdominal pain that continues even after rest, or gets worse. MAKE SURE YOU:   Understand these instructions.  Will watch your condition.  Will get help right away if you  are not doing well or get worse.   This information is not intended to replace advice given to you by your health care provider. Make sure you discuss any questions you have with your health care provider.   Document Released: 05/14/2005 Document Revised: 03/04/2013 Document Reviewed: 12/11/2012 Elsevier Interactive Patient Education [employer]

## 2015-08-04 NOTE — ED Provider Notes (Signed)
CSN: [id number]     Arrival date & time 08/04/15  1156 History   First MD Initiated Contact with Patient 08/04/15 1805     Chief Complaint  Patient presents with  . Abdominal Pain  . Constipation     (Consider location/radiation/quality/duration/timing/severity/associated sxs/prior Treatment) HPI Emmajane Altamura is a 20 y.o. female  With no medical problems presents to ED with complaint of abdominal pain. Patient states she was diagnosed with strep throat 4 days ago. She was also diagnosed with bacterial vaginosis. She also found out she was pregnant. She was prescribed Flagyl and amoxicillin, which she stopped taking 2 days ago because of persistent nausea and vomiting. Patient states she believes that the medication is making her nausea and vomiting worse. Patient states that gradually since then she has had increased pain in the left lower abdomen. She denies any urinary symptoms. She reports mild vaginal spotting which now turned into a brownish discharge. She also reports being constipated and states her last bowel movement was 2 days ago and was small hard pellets. She has not tried any medications to help with constipation or pain. She admits to nausea, denies vomiting. She denies any fever or chills. No other URI symptoms at this time.  History reviewed. No pertinent past medical history. Past Surgical History  Procedure Laterality Date  . Adenoidectomy     History reviewed. No pertinent family history. Social History  Substance Use Topics  . Smoking status: Light Tobacco Smoker  . Smokeless tobacco: None  . Alcohol Use: Yes   OB History    Gravida Para Term Preterm AB TAB SAB Ectopic Multiple Living   1              Review of Systems  Constitutional: Negative for fever and chills.  Respiratory: Negative for cough, chest tightness and shortness of breath.   Cardiovascular: Negative for chest pain, palpitations and leg swelling.  Gastrointestinal: Positive for nausea, abdominal  pain and constipation. Negative for vomiting and diarrhea.  Genitourinary: Positive for vaginal bleeding and pelvic pain. Negative for dysuria, flank pain, vaginal discharge and vaginal pain.  Musculoskeletal: Negative for myalgias, arthralgias, neck pain and neck stiffness.  Skin: Negative for rash.  Neurological: Negative for dizziness, weakness and headaches.  All other systems reviewed and are negative.     Allergies  Review of patient's allergies indicates no known allergies.  Home Medications   Prior to Admission medications   Medication Sig Start Date End Date Taking? Authorizing Provider  amoxicillin (AMOXIL) 500 MG capsule Take 1 capsule (500 mg total) by mouth 3 (three) times daily. 07/31/15  Yes Tiffany Neva Seat, PA-C  metroNIDAZOLE (FLAGYL) 500 MG tablet Take 1 tablet (500 mg total) by mouth 2 (two) times daily. 07/31/15  Yes [name], PA-C  Prenatal Vit-Fe Fumarate-FA (PRENATAL COMPLETE) 14-0.4 MG TABS Take 1 tablet by mouth 2 (two) times daily. 07/31/15  Yes Tiffany Neva Seat, PA-C   BP 120/87 mmHg  Pulse 80  Temp(Src) 97.9 F (36.6 C) (Oral)  Resp 16  Ht  (1.626 m)  Wt 61.236 kg  BMI 23.16 kg/m2  SpO2 100%  LMP 06/24/2015 Physical Exam  Constitutional: She is oriented to person, place, and time. She appears well-developed and well-nourished. No distress.  HENT:  Head: Normocephalic.  Eyes: Conjunctivae are normal.  Neck: Neck supple.  Cardiovascular: Normal rate, regular rhythm and normal heart sounds.   Pulmonary/Chest: Effort normal and breath sounds normal. No respiratory distress. She has no wheezes. She has no  rales.  Abdominal: Soft. Bowel sounds are normal. She exhibits no distension. There is tenderness. There is no rebound.  LLQ tenderness  Genitourinary:  Normal external genitalia. Normal vaginal canal. Small bright red blood in vaginal canal. Cervix is normal, closed. No CMT. Left adnexal tenderness. No masses palpated.    Musculoskeletal: She  exhibits no edema.  Neurological: She is alert and oriented to person, place, and time.  Skin: Skin is warm and dry.  Psychiatric: She has a normal mood and affect. Her behavior is normal.  Nursing note and vitals reviewed.   ED Course  Procedures (including critical care time) Labs Review Labs Reviewed  COMPREHENSIVE METABOLIC PANEL - Abnormal; Notable for the following:    Sodium 133 (*)    CO2 20 (*)    Total Protein 8.3 (*)    Total Bilirubin 1.4 (*)    All other components within normal limits  CBC - Abnormal; Notable for the following:    WBC 12.3 (*)    All other components within normal limits  URINALYSIS, ROUTINE W REFLEX MICROSCOPIC (NOT AT [facility]) - Abnormal; Notable for the following:    Color, Urine AMBER (*)    APPearance CLOUDY (*)    Specific Gravity, Urine 1.037 (*)    Ketones, ur >80 (*)    Protein, ur 30 (*)    All other components within normal limits  HCG, QUANTITATIVE, PREGNANCY - Abnormal; Notable for the following:    hCG, Beta Chain, Quant, S [id number] (*)    All other components within normal limits  URINE MICROSCOPIC-ADD ON - Abnormal; Notable for the following:    Squamous Epithelial / LPF 6-30 (*)    Bacteria, UA RARE (*)    All other components within normal limits  I-STAT BETA HCG BLOOD, ED ([facility], [facility], [facility] ONLY) - Abnormal; Notable for the following:    I-stat hCG, quantitative >2000.0 (*)    All other components within normal limits  URINE CULTURE  LIPASE, BLOOD  ABO/RH    Imaging Review [place] Ob Comp Less 14 Wks  08/04/2015  CLINICAL DATA:  First trimester pregnancy, pelvic pain and constipation EXAM: OBSTETRIC <14 WK US AND TRANSVAGINAL OB US TECHNIQUE: Both transabdominal and transvaginal ultrasound examinations were performed for complete evaluation of the gestation as well as the maternal uterus, adnexal regions, and pelvic cul-de-sac. Transvaginal technique was performed to assess early pregnancy. COMPARISON:  None. FINDINGS: Intrauterine  gestational sac: Visualized/normal in shape. Yolk sac:  Present Embryo:  Present Cardiac Activity: Present Heart Rate: 137  bpm CRL:  4  mm   6 w   0 d                  US EDC: 03/29/2016 Subchorionic hemorrhage:  None visualized. Maternal uterus/adnexae: Right ovary contains a 16 mm corpus luteum. Left ovary is normal. There is no free fluid. IMPRESSION: Live intrauterine gestation Electronically Signed   By: Esperanza Heiraymond  Rubner M.D.   On: 08/04/2015 19:36   [place] Ob Transvaginal  08/04/2015  CLINICAL DATA:  First trimester pregnancy, pelvic pain and constipation EXAM: OBSTETRIC <14 WK US AND TRANSVAGINAL OB US TECHNIQUE: Both transabdominal and transvaginal ultrasound examinations were performed for complete evaluation of the gestation as well as the maternal uterus, adnexal regions, and pelvic cul-de-sac. Transvaginal technique was performed to assess early pregnancy. COMPARISON:  None. FINDINGS: Intrauterine gestational sac: Visualized/normal in shape. Yolk sac:  Present Embryo:  Present Cardiac Activity: Present Heart Rate: 137  bpm CRL:  4  mm   6 w   0 d                  [place] EDC: 03/29/2016 Subchorionic hemorrhage:  None visualized. Maternal uterus/adnexae: Right ovary contains a 16 mm corpus luteum. Left ovary is normal. There is no free fluid. IMPRESSION: Live intrauterine gestation Electronically Signed   By: Esperanza Heir M.D.   On: 08/04/2015 19:36   I have personally reviewed and evaluated these images and lab results as part of my medical decision-making.   EKG Interpretation None      MDM   Final diagnoses:  Abdominal pain in pregnancy  Constipation, unspecified constipation type    Patient with worsening left lower quadrant pain. Patient is approximately [redacted] weeks pregnant. Has not had an ultrasound to confirm IUP. She also reports mild vaginal spotting. Will get ultrasound to rule out ectopic pregnancy. Will perform pelvic exam, although she had one done 4 days ago and diagnosed with BV.   Labs ordered. Patient's pain may also be due to constipation.  pts exam shows small blood. RH+. Cervix closed. [place] negative other than normal pregnancy at [redacted]wk gestation. Pt states she thinks she is constipated. Will treat with an enema and may relax. Patient to continue Tylenol and prenatal vitamins for pain. Patient is requesting nausea medication, will prescribe diclegis. Home with outpatient follow up with OB/GYN.   Filed Vitals:   08/04/15 1237 08/04/15 1500 08/04/15 2000 08/04/15 2108  BP: 132/101 120/87 117/72 133/86  Pulse: 82 80 87 84  Temp: 98.2 F (36.8 C) 97.9 F (36.6 C)    TempSrc: Oral Oral    Resp: Height:  (1.626 m)     Weight: 61.236 kg     SpO2: 93% 100% 99% 100%       Jaynie Crumble, PA-C 08/05/15 0142  Arby Barrette, MD 08/10/15 [phone]

## 2015-08-04 NOTE — Progress Notes (Signed)
EDCM spoke to patient at bedside. Patient confirms she does not have a pcp or insurance living in [place].  EDCM provided patient with pamphlet to [facility], informed patient of services there.  EDCM also provided patient with list of pcps who accept self pay patients, list of discount pharmacies and websites needymeds.org and GoodRX.com for medication assistance, phone number to inquire about the orange card, phone number to inquire about Medicaid, phone number to inquire about the Affordable Care Act, financial resources in the community such as local churches, [employer], urban ministries, and dental assistance for uninsured patients.  Patient thankful for resources.  No further EDCM needs at this time. 

## 2015-08-04 NOTE — ED Notes (Signed)
During blood draw, Pt sts that nausea is relieved, but pain remains.

## 2015-08-04 NOTE — ED Notes (Signed)
Pt c/o emesis x 2 weeks, L side abdominal cramping/squeezing x 1 week and constipation x 8 days.  Pain score 8/10.  [facility] and diagnosed w/ strep and BV.  Pt reports that she stopped taking medications because they made her "sick."  Pt was also informed that she is pregnant.

## 2015-08-06 LAB — URINE CULTURE

## 2015-09-08 LAB — OB RESULTS CONSOLE GC/CHLAMYDIA
CHLAMYDIA, DNA PROBE: NEGATIVE
GC PROBE AMP, GENITAL: NEGATIVE

## 2015-09-08 LAB — OB RESULTS CONSOLE RUBELLA ANTIBODY, IGM: RUBELLA: IMMUNE

## 2015-09-08 LAB — OB RESULTS CONSOLE ANTIBODY SCREEN: ANTIBODY SCREEN: NEGATIVE

## 2015-09-08 LAB — OB RESULTS CONSOLE RPR: RPR: NONREACTIVE

## 2015-09-08 LAB — OB RESULTS CONSOLE ABO/RH: RH Type: POSITIVE

## 2015-09-08 LAB — OB RESULTS CONSOLE HEPATITIS B SURFACE ANTIGEN: HEP B S AG: NEGATIVE

## 2015-09-08 LAB — OB RESULTS CONSOLE HIV ANTIBODY (ROUTINE TESTING): HIV: NONREACTIVE

## 2016-02-21 ENCOUNTER — Encounter (HOSPITAL_COMMUNITY): Payer: Self-pay

## 2016-02-21 ENCOUNTER — Inpatient Hospital Stay (HOSPITAL_COMMUNITY)
Admission: EM | Admit: 2016-02-21 | Discharge: 2016-02-21 | Disposition: A | Discharge status: Home / Self Care | Payer: Medicaid Other | Attending: Obstetrics and Gynecology | Admitting: Obstetrics and Gynecology

## 2016-02-21 ENCOUNTER — Other Ambulatory Visit: Payer: Self-pay

## 2016-02-21 DIAGNOSIS — O99613 Diseases of the digestive system complicating pregnancy, third trimester: Secondary | ICD-10-CM | POA: Insufficient documentation

## 2016-02-21 DIAGNOSIS — R197 Diarrhea, unspecified: Secondary | ICD-10-CM

## 2016-02-21 DIAGNOSIS — K529 Noninfective gastroenteritis and colitis, unspecified: Secondary | ICD-10-CM | POA: Insufficient documentation

## 2016-02-21 DIAGNOSIS — O321XX0 Maternal care for breech presentation, not applicable or unspecified: Secondary | ICD-10-CM | POA: Insufficient documentation

## 2016-02-21 DIAGNOSIS — Z3A34 34 weeks gestation of pregnancy: Secondary | ICD-10-CM | POA: Diagnosis not present

## 2016-02-21 DIAGNOSIS — R509 Fever, unspecified: Secondary | ICD-10-CM

## 2016-02-21 DIAGNOSIS — R000 Tachycardia, unspecified: Secondary | ICD-10-CM

## 2016-02-21 LAB — CBC WITH DIFFERENTIAL/PLATELET
BASOS ABS: 0 10*3/uL (ref 0.0–0.1)
BASOS PCT: 0 %
EOS PCT: 0 %
Eosinophils Absolute: 0 10*3/uL (ref 0.0–0.7)
HEMATOCRIT: 34.1 % — AB (ref 36.0–46.0)
Hemoglobin: 11.4 g/dL — ABNORMAL LOW (ref 12.0–15.0)
Lymphocytes Relative: 6 %
Lymphs Abs: 0.9 10*3/uL (ref 0.7–4.0)
MCH: 29.8 pg (ref 26.0–34.0)
MCHC: 33.4 g/dL (ref 30.0–36.0)
MCV: 89.3 fL (ref 78.0–100.0)
MONO ABS: 0.8 10*3/uL (ref 0.1–1.0)
Monocytes Relative: 6 %
NEUTROS ABS: 12.9 10*3/uL — AB (ref 1.7–7.7)
NEUTROS PCT: 88 %
PLATELETS: 180 10*3/uL (ref 150–400)
RBC: 3.82 MIL/uL — AB (ref 3.87–5.11)
RDW: 13.3 % (ref 11.5–15.5)
WBC: 14.6 10*3/uL — AB (ref 4.0–10.5)

## 2016-02-21 LAB — URINALYSIS, ROUTINE W REFLEX MICROSCOPIC
BILIRUBIN URINE: NEGATIVE
Glucose, UA: NEGATIVE mg/dL
HGB URINE DIPSTICK: NEGATIVE
Ketones, ur: NEGATIVE mg/dL
Leukocytes, UA: NEGATIVE
NITRITE: NEGATIVE
PROTEIN: NEGATIVE mg/dL
SPECIFIC GRAVITY, URINE: 1.014 (ref 1.005–1.030)
pH: 6 (ref 5.0–8.0)

## 2016-02-21 LAB — BASIC METABOLIC PANEL
Anion gap: 10 (ref 5–15)
BUN: 7 mg/dL (ref 6–20)
CALCIUM: 8.8 mg/dL — AB (ref 8.9–10.3)
CO2: 20 mmol/L — ABNORMAL LOW (ref 22–32)
CREATININE: 0.81 mg/dL (ref 0.44–1.00)
Chloride: 106 mmol/L (ref 101–111)
GFR calc Af Amer: >60 mL/min (ref >60)
Glucose, Bld: 73 mg/dL (ref 65–99)
Potassium: 3.9 mmol/L (ref 3.5–5.1)
SODIUM: 136 mmol/L (ref 135–145)

## 2016-02-21 LAB — TYPE AND SCREEN
ABO/RH(D): O POS
ANTIBODY SCREEN: NEGATIVE

## 2016-02-21 LAB — MAGNESIUM: Magnesium: 1.6 mg/dL — ABNORMAL LOW (ref 1.7–2.4)

## 2016-02-21 LAB — ABO/RH: ABO/RH(D): O POS

## 2016-02-21 LAB — LACTIC ACID, PLASMA: Lactic Acid, Venous: 1 mmol/L (ref 0.5–1.9)

## 2016-02-21 MED ORDER — ACETAMINOPHEN 325 MG PO TABS
650.0000 mg | ORAL_TABLET | Freq: Four times a day (QID) | ORAL | Status: DC | PRN
  Administered 2016-02-21: 650 mg via ORAL
  Filled 2016-02-21: qty 2

## 2016-02-21 MED ORDER — SODIUM CHLORIDE 0.9 % IV SOLN
INTRAVENOUS | Status: DC
  Administered 2016-02-21: 21:00:00 via INTRAVENOUS

## 2016-02-21 MED ORDER — SODIUM CHLORIDE 0.9 % IV BOLUS (SEPSIS)
1000.0000 mL | Freq: Once | INTRAVENOUS | Status: AC
  Administered 2016-02-21: 1000 mL via INTRAVENOUS

## 2016-02-21 MED ORDER — BETAMETHASONE SOD PHOS & ACET 6 (3-3) MG/ML IJ SUSP
12.0000 mg | INTRAMUSCULAR | Status: DC
  Administered 2016-02-21: 12 mg via INTRAMUSCULAR
  Filled 2016-02-21: qty 2

## 2016-02-21 NOTE — ED Triage Notes (Signed)
Per Pt, Pt is coming from home with intermittent abdominal cramps that wrap around to her back. Pt reports starting when she woke up this morning. Intermittent about 5-10 minutes apart. Pt has Hx of a short Cervix and takes medication for this. Also, complains of diarrhea that started this morning. Denies vomiting. Reports eating and drinking okay.

## 2016-02-21 NOTE — MAU Note (Signed)
[facility]. Pt states she starting feeling cramping this morning around 10am. Pt states this continued all day on and off. Pt states she started feeling vaginal pressure around 2pm. Pt denies bleeding and leaking of fluid. Pt states the baby is moving normally.

## 2016-02-21 NOTE — Progress Notes (Signed)
[facility], [facility].

## 2016-02-21 NOTE — MAU Provider Note (Signed)
Chief Complaint:  Abdominal Cramping   First Provider Initiated Contact with Patient 02/21/16 2037     HPI: Stacey Guzman is a 20 y.o. G1P0 at 8440w4d who presents to maternity admissions reporting fever, new onset diarrhea, and  Cramping/contractions.  It started at 10am.  Diarrhea started this evening.  No sick contacts.. She reports good fetal movement, denies LOF, vaginal bleeding, vaginal itching/burning, urinary symptoms, h/a, dizziness, n/v, diarrhea, constipation or fever/chills.  She denies headache, visual changes or RUQ abdominal pain.  Fever   This is a new problem. The current episode started today. The problem occurs constantly. The problem has been gradually worsening. The maximum temperature noted was 101 to 101.9 F. The temperature was taken using an oral thermometer. Associated symptoms include abdominal pain and diarrhea. Pertinent negatives include no congestion, coughing, headaches, muscle aches, nausea or vomiting. She has tried nothing for the symptoms. The treatment provided no relief.  Diarrhea   This is a new problem. The current episode started today. The problem occurs 2 to 4 times per day. The problem has been unchanged. Associated symptoms include abdominal pain and a fever. Pertinent negatives include no coughing, headaches or vomiting. There are no known risk factors. She has tried nothing for the symptoms.   ED Note: [facility]. Pt states she starting feeling cramping this morning around 10am. Pt states this continued all day on and off. Pt states she started feeling vaginal pressure around 2pm. Pt denies bleeding and leaking of fluid. Pt states the baby is moving normally. Patient is G1 P0 complains of lower abdominal cramping started and proximal with 10 AM today. Patient in no distress On toco monitor she is contracting approximately every 2 minutes. Fetal heart tones 155. Dr. Stefano GaulStringer consulted via telephone request transfer  [facility]. Care Link called  Past Medical History: Past Medical History:  Diagnosis Date  . Medical history non-contributory     Past obstetric history: OB History  Gravida Para Term Preterm AB Living  1            SAB TAB Ectopic Multiple Live Births               # Outcome Date GA Lbr Len/2nd Weight Sex Delivery Anes PTL Lv  1 Current               Past Surgical History: Past Surgical History:  Procedure Laterality Date  . ADENOIDECTOMY      Family History: Family History  Problem Relation Age of Onset  . Hypertension Maternal Grandfather   . Hypertension Paternal Grandfather     Social History: Social History  Substance Use Topics  . Smoking status: Never Smoker  . Smokeless tobacco: Never Used  . Alcohol use Yes    Allergies: No Known Allergies  Meds:  Prescriptions Prior to Admission  Medication Sig Dispense Refill Last Dose  . Prenatal Vit-Fe Fumarate-FA (PRENATAL COMPLETE) 14-0.4 MG TABS Take 1 tablet by mouth 2 (two) times daily. 60 each 2 02/20/2016 at Unknown time  . progesterone 200 MG SUPP Place 200 mg vaginally at bedtime.   02/20/2016 at Unknown time    I have reviewed patient's Past Medical Hx, Surgical Hx, Family Hx, Social Hx, medications and allergies.   ROS:  Review of Systems  Constitutional: Positive for fever.  HENT: Negative for congestion.   Respiratory: Negative for cough.   Gastrointestinal: Positive for abdominal pain and diarrhea. Negative for  nausea and vomiting.  Neurological: Negative for headaches.   Other systems negative  Physical Exam  Patient Vitals for the past 24 hrs:  BP Temp Temp src Pulse Resp SpO2 Height Weight  02/21/16 2059 - - - 110 - 100 % - -  02/21/16 2057 - 101.4 F (38.6 C) Oral (!) 126 - 100 % - -  02/21/16 2052 - - - (!) 124 - 100 % - -  02/21/16 2044 - - - (!) 122 - 100 % - -  02/21/16 2039 - - - 119 - 100 % - -  02/21/16 2034 - - - (!) 121 - 100 % - -   02/21/16 1915 124/81 - - - - - - -  02/21/16 1900 123/81 - - - - - - -  02/21/16 1845 121/83 - - - - - - -  02/21/16 1830 117/81 - - - - - - -  02/21/16 1815 118/78 - - - - - - -  02/21/16 1800 116/80 - - - - - - -  02/21/16 1730 118/76 - - 117 - 97 % - -  02/21/16 1729 - - - - - - 5\' 4"  (1.626 m) 158 lb (71.7 kg)  02/21/16 1715 113/72 - - 110 - 96 % - -  02/21/16 1713 113/72 99.5 F (37.5 C) Oral 119 18 100 % - -   Constitutional: Well-developed, well-nourished female in no acute distress.  Cardiovascular: normal rate and rhythm Respiratory: normal effort, clear to auscultation bilaterally GI: Abd soft, tender over right mid abdomen,  gravid appropriate for gestational age.   No rebound or guarding. MS: Extremities nontender, no edema, normal ROM Neurologic: Alert and oriented x 4.  GU: Neg CVAT.  Dilation: 1 Effacement (%): 80 Cervical Position: Posterior Station: -2 Presentation:  (breech per bedside u/s) Exam by:: m Mayfield Schoene,cnm  FHT:  Baseline 190 , moderate variability, accelerations absent, no decelerations Contractions: q 2-7 mins Irregular     Breech presentation with head on maternal right confirmed by bedside US by me  Labs: Results for orders placed or performed during the hospital encounter of 02/21/16 (from the past 24 hour(s))  Urinalysis, Routine w reflex microscopic (not at [facility])     Status: None   Collection Time: 02/21/16  6:39 PM  Result Value Ref Range   Color, Urine YELLOW YELLOW   APPearance CLEAR CLEAR   Specific Gravity, Urine 1.014 1.005 - 1.030   pH 6.0 5.0 - 8.0   Glucose, UA NEGATIVE NEGATIVE mg/dL   Hgb urine dipstick NEGATIVE NEGATIVE   Bilirubin Urine NEGATIVE NEGATIVE   Ketones, ur NEGATIVE NEGATIVE mg/dL   Protein, ur NEGATIVE NEGATIVE mg/dL   Nitrite NEGATIVE NEGATIVE   Leukocytes, UA NEGATIVE NEGATIVE  CBC with Differential     Status: Abnormal   Collection Time: 02/21/16  7:00 PM  Result Value Ref Range   WBC 14.6 (H) 4.0 -  10.5 K/uL   RBC 3.82 (L) 3.87 - 5.11 MIL/uL   Hemoglobin 11.4 (L) 12.0 - 15.0 g/dL   HCT 16.1 (L) 09.6 - 04.5 %   MCV 89.3 78.0 - 100.0 fL   MCH 29.8 26.0 - 34.0 pg   MCHC 33.4 30.0 - 36.0 g/dL   RDW 40.9 81.1 - 91.4 %   Platelets 180 150 - 400 K/uL   Neutrophils Relative % 88 %   Neutro Abs 12.9 (H) 1.7 - 7.7 K/uL   Lymphocytes Relative 6 %   Lymphs Abs 0.9 0.7 -  4.0 K/uL   Monocytes Relative 6 %   Monocytes Absolute 0.8 0.1 - 1.0 K/uL   Eosinophils Relative 0 %   Eosinophils Absolute 0.0 0.0 - 0.7 K/uL   Basophils Relative 0 %   Basophils Absolute 0.0 0.0 - 0.1 K/uL  Type and screen [facility]     Status: None   Collection Time: 02/21/16  7:00 PM  Result Value Ref Range   ABO/RH(D) O POS    Antibody Screen NEG    Sample Expiration 02/24/2016   Basic metabolic panel     Status: Abnormal   Collection Time: 02/21/16  7:00 PM  Result Value Ref Range   Sodium 136 135 - 145 mmol/L   Potassium 3.9 3.5 - 5.1 mmol/L   Chloride 106 101 - 111 mmol/L   CO2 20 (L) 22 - 32 mmol/L   Glucose, Bld 73 65 - 99 mg/dL   BUN 7 6 - 20 mg/dL   Creatinine, Ser 1.61 0.44 - 1.00 mg/dL   Calcium 8.8 (L) 8.9 - 10.3 mg/dL   GFR calc non Af Amer >60 >60 mL/min   GFR calc Af Amer >60 >60 mL/min   Anion gap 10 5 - 15  Magnesium     Status: Abnormal   Collection Time: 02/21/16  7:00 PM  Result Value Ref Range   Magnesium 1.6 (L) 1.7 - 2.4 mg/dL  ABO/Rh     Status: None   Collection Time: 02/21/16  7:00 PM  Result Value Ref Range   ABO/RH(D) O POS    No rh immune globuloin NOT A RH IMMUNE GLOBULIN CANDIDATE, PT RH POSITIVE    --/--/O POS, O POS (09/26 1900)  Imaging:  No results found.  MAU Course/MDM: I have ordered labs and reviewed results.  NST reviewed Consult Dr Charlotta Newton with presentation, exam findings and test results.  Informed of patient's new fever, new diarrhea and tachycardia.  She does not want to institute the full sepsis protocol at this time, but we will get  Lactic Acid and blood cultures. I ordered a NS bolus. EKG ordered. Treatments in MAU included NS bolus, blood cultures, lactic acid level.    Assessment: 1. Noninfectious gastroenteritis, unspecified   2. Preterm labor without delivery     Plan: Discussed with NICU MD who states we cannot admit this patient due to NICU being full Dr Charlotta Newton contacted Dr Sherrie George who recommends transfer to [facility] to come talk with patient.   Wynelle Bourgeois CNM, MSN Certified Nurse-Midwife 02/21/2016 9:05 PM

## 2016-02-21 NOTE — ED Notes (Signed)
Resident at the bedside

## 2016-02-21 NOTE — Progress Notes (Signed)
Pt is a G1P0  At 34 4/[redacted] weeks gestation with c/o lower abd cramping since 1000 this morning. Says she has a short cervix and her baby is breech. Says she has been using progesterone suppositories this pregnancy. No vaginal bleeding or leaking of fluid.

## 2016-02-21 NOTE — ED Provider Notes (Signed)
Patient is G1 P0 complains of lower abdominal cramping started and proximal with 10 AM today. Patient in no distress On toco monitor she is contracting approximately every 2 minutes. Fetal heart tones 155. Dr. Stefano GaulStringer consulted via telephone request transfer [facility] matern[facility]. [facility] called   [name], MD 02/21/16 [phone]

## 2016-02-21 NOTE — ED Provider Notes (Signed)
[facility]-EMERGENCY DEPT Provider Note   CSN: [id number] Arrival date & time: 02/21/16  1657     History   Chief Complaint Chief Complaint  Patient presents with  . Abdominal Cramping    HPI Stacey Guzman is a 20 y.o. female.  The history is provided by the patient.  Abdominal Cramping  This is a new problem. The current episode started 6 to 12 hours ago (began around 1000 today, pregnant 34.4). The problem occurs hourly (3-4 times an hour, every 15-20 minutes). The problem has been gradually worsening. Associated symptoms include abdominal pain. Pertinent negatives include no chest pain, no headaches and no shortness of breath. Nothing aggravates the symptoms. Nothing relieves the symptoms. She has tried nothing for the symptoms.    History reviewed. No pertinent past medical history.  There are no active problems to display for this patient.   Past Surgical History:  Procedure Laterality Date  . ADENOIDECTOMY      OB History    Gravida Para Term Preterm AB Living   1             SAB TAB Ectopic Multiple Live Births                   Home Medications    Prior to Admission medications   Medication Sig Start Date End Date Taking? Authorizing Provider  amoxicillin (AMOXIL) 500 MG capsule Take 1 capsule (500 mg total) by mouth 3 (three) times daily. 07/31/15   Tiffany Neva Seat, PA-C  Doxylamine-Pyridoxine (DICLEGIS) 10-10 MG TBEC Take 2 tablets by mouth at bedtime. 08/04/15   Tatyana Kirichenko, PA-C  metroNIDAZOLE (FLAGYL) 500 MG tablet Take 1 tablet (500 mg total) by mouth 2 (two) times daily. 07/31/15   Tiffany Neva Seat, PA-C  metroNIDAZOLE (METROGEL VAGINAL) 0.75 % vaginal gel Place 1 Applicatorful vaginally 2 (two) times daily. 08/04/15   Tatyana Kirichenko, PA-C  polyethylene glycol (MIRALAX) packet Take 17 g by mouth daily. 08/04/15   Jaynie Crumble, PA-C  Prenatal Vit-Fe Fumarate-FA (PRENATAL COMPLETE) 14-0.4 MG TABS Take 1 tablet by mouth 2 (two) times daily. 07/31/15    Tiffany Neva Seat, PA-C  sodium phosphate (FLEET) 7-19 GM/118ML ENEM Place 133 mLs (1 enema total) rectally once. 08/04/15   Jaynie Crumble, PA-C    Family History No family history on file.  Social History Social History  Substance Use Topics  . Smoking status: Light Tobacco Smoker  . Smokeless tobacco: Never Used  . Alcohol use Yes     Allergies   Review of patient's allergies indicates no known allergies.   Review of Systems Review of Systems  Constitutional: Negative for chills, fatigue and fever.  Respiratory: Negative for shortness of breath.   Cardiovascular: Negative for chest pain.  Gastrointestinal: Positive for abdominal pain.  Genitourinary: Negative for dysuria and flank pain.  Musculoskeletal: Positive for back pain. Negative for neck pain.  Skin: Negative for rash.  Neurological: Negative for headaches.  All other systems reviewed and are negative.    Physical Exam Updated Vital Signs BP 117/81   Pulse 117   Temp 99.5 F (37.5 C) (Oral)   Resp 18   Ht 5\' 4"  (1.626 m)   Wt 71.7 kg   LMP 06/24/2015   SpO2 97%   BMI 27.12 kg/m   Physical Exam  Constitutional: She is oriented to person, place, and time. She appears well-developed and well-nourished. No distress.  Pleasant, cooperative, well-appearing  HENT:  Head: Normocephalic and atraumatic.  Eyes: Conjunctivae are normal. No  scleral icterus.  Neck: Normal range of motion. Neck supple.  Cardiovascular: Regular rhythm.   Tachycardic to 105  Pulmonary/Chest: Effort normal and breath sounds normal. No respiratory distress.  Abdominal: Soft. There is no tenderness.  Gravid abdomen, nontender uterus  Musculoskeletal: She exhibits no edema.  Neurological: She is alert and oriented to person, place, and time. She exhibits normal muscle tone. Coordination normal.  Skin: Skin is warm and dry. Capillary refill takes less than 2 seconds. No rash noted. She is not diaphoretic. No pallor.  Psychiatric:  She has a normal mood and affect.  Nursing note and vitals reviewed.    ED Treatments / Results  Labs (all labs ordered are listed, but only abnormal results are displayed) Labs Reviewed  URINALYSIS, ROUTINE W REFLEX MICROSCOPIC (NOT AT [facility])  CBC WITH DIFFERENTIAL/PLATELET  BASIC METABOLIC PANEL  MAGNESIUM  TYPE AND SCREEN    EKG  EKG Interpretation None       Radiology No results found.  Procedures Procedures (including critical care time)  Medications Ordered in ED Medications  sodium chloride 0.9 % bolus 1,000 mL (1,000 mLs Intravenous New Bag/Given 02/21/16 1816)     Initial Impression / Assessment and Plan / ED Course  I have reviewed the triage vital signs and the nursing notes.  Pertinent labs & imaging results that were available during my care of the patient were reviewed by me and considered in my medical decision making (see chart for details).  Clinical Course   Larey SeatDestony Depierro is a 20 y.o. female who is a G1P0 at 34 weeks 4 days gestation, uncomplicated pregnancy except for "short cervix" per patient without h/o bacterial infections during pregnancy per pt, who presents to ED for regular ctx since 1000 today, now occurring every 15-20 minutes (about 3-4 times an hour) at time of presentation. Though pt only feels them this often, noted on Toco to have regular ctx every 5 minutes. Pt feeling normal fetal movement. FHR 140-170's with accel's on toco. Pt denies vaginal discharge except for some "red" discharge, which she has been having since starting red tablets per vagina for the short cervix. Pt denies sensation of LOF. Denies fevers. OB RN at bedside throughout ED stay. Given 1L NS bolus. [facility].   Pt condition, course, and transfer were discussed with attending physician Dr. Martie RoundSam Jacubwitz.  Final Clinical Impressions(s) / ED Diagnoses   Final diagnoses:  Preterm labor without delivery    New Prescriptions New  Prescriptions   No medications on file     Horald PollenAudrey Jehan Ranganathan, MD 02/21/16 [phone]    Doug SouSam Jacubowitz, MD 02/22/16 0025

## 2016-02-21 NOTE — ED Notes (Signed)
Pt is on her first pregnancy.

## 2016-02-22 ENCOUNTER — Encounter: Payer: Self-pay | Admitting: Advanced Practice Midwife

## 2016-02-23 LAB — URINE CULTURE

## 2016-02-27 LAB — CULTURE, BLOOD (ROUTINE X 2)
CULTURE: NO GROWTH
Culture: NO GROWTH

## 2016-03-09 ENCOUNTER — Other Ambulatory Visit: Payer: Self-pay | Admitting: Obstetrics and Gynecology

## 2016-03-16 ENCOUNTER — Telehealth (HOSPITAL_COMMUNITY): Payer: Self-pay | Admitting: *Deleted

## 2016-03-16 ENCOUNTER — Encounter (HOSPITAL_COMMUNITY): Payer: Self-pay

## 2016-03-16 NOTE — Telephone Encounter (Signed)
Preadmission screen email sent.  Unable to leave voicemail

## 2016-03-19 ENCOUNTER — Telehealth (HOSPITAL_COMMUNITY): Payer: Self-pay | Admitting: *Deleted

## 2016-03-19 ENCOUNTER — Encounter (HOSPITAL_COMMUNITY): Payer: Self-pay

## 2016-03-19 NOTE — Telephone Encounter (Signed)
Preadmission screen Left message with aunt contact person and significant other asking patient to call me

## 2016-03-23 ENCOUNTER — Encounter (HOSPITAL_COMMUNITY)
Admission: RE | Admit: 2016-03-23 | Discharge: 2016-03-23 | Disposition: A | Discharge status: Home / Self Care | Payer: Medicaid Other | Source: Ambulatory Visit | Attending: Obstetrics and Gynecology | Admitting: Obstetrics and Gynecology

## 2016-03-23 LAB — CBC
HEMATOCRIT: 33.6 % — AB (ref 36.0–46.0)
HEMOGLOBIN: 11.6 g/dL — AB (ref 12.0–15.0)
MCH: 29.4 pg (ref 26.0–34.0)
MCHC: 34.5 g/dL (ref 30.0–36.0)
MCV: 85.3 fL (ref 78.0–100.0)
Platelets: 240 10*3/uL (ref 150–400)
RBC: 3.94 MIL/uL (ref 3.87–5.11)
RDW: 13.9 % (ref 11.5–15.5)
WBC: 16 10*3/uL — AB (ref 4.0–10.5)

## 2016-03-23 LAB — TYPE AND SCREEN
ABO/RH(D): O POS
Antibody Screen: NEGATIVE

## 2016-03-23 LAB — ABO/RH: ABO/RH(D): O POS

## 2016-03-23 NOTE — Patient Instructions (Signed)
20 Larey SeatDestony Guzman  03/23/2016   Your procedure is scheduled on:  03/26/2016  [facility] at 0745 AM.  Pick up the phone at the desk and dial [phone].   Call this number if you have problems the morning of surgery: [phone]   Remember:   Do not eat food:After Midnight.  Do not drink clear liquids: After Midnight.  Take these medicines the morning of surgery with A SIP OF WATER: none   Do not wear jewelry, make-up or nail polish.  Do not wear lotions, powders, or perfumes. Do not wear deodorant.  Do not shave 48 hours prior to surgery.  Do not bring valuables to the hospital.  [facility] is not   responsible for any belongings or valuables brought to the hospital.  Contacts, dentures or bridgework may not be worn into surgery.  Leave suitcase in the car. After surgery it may be brought to your room.  For patients admitted to the hospital, checkout time is 11:00 AM the day of              discharge.   Patients discharged the day of surgery will not be allowed to drive             home.  Name and phone number of your driver: na  Special Instructions:   N/A   Please read over the following fact sheets that you were given:   Surgical Site Infection Prevention

## 2016-03-24 LAB — RPR: RPR: NONREACTIVE

## 2016-03-26 ENCOUNTER — Inpatient Hospital Stay (HOSPITAL_COMMUNITY): Payer: Medicaid Other | Admitting: Anesthesiology

## 2016-03-26 ENCOUNTER — Encounter (HOSPITAL_COMMUNITY): Payer: Self-pay

## 2016-03-26 ENCOUNTER — Inpatient Hospital Stay (HOSPITAL_COMMUNITY)
Admission: AD | Admit: 2016-03-26 | Discharge: 2016-03-29 | DRG: 765 | Disposition: A | Discharge status: Home / Self Care | Payer: Medicaid Other | Source: Ambulatory Visit | Attending: Obstetrics and Gynecology | Admitting: Obstetrics and Gynecology

## 2016-03-26 ENCOUNTER — Encounter (HOSPITAL_COMMUNITY)
Admission: AD | Disposition: A | Discharge status: Home / Self Care | Payer: Self-pay | Source: Ambulatory Visit | Attending: Obstetrics and Gynecology

## 2016-03-26 DIAGNOSIS — O321XX0 Maternal care for breech presentation, not applicable or unspecified: Secondary | ICD-10-CM | POA: Diagnosis present

## 2016-03-26 DIAGNOSIS — Z8249 Family history of ischemic heart disease and other diseases of the circulatory system: Secondary | ICD-10-CM | POA: Diagnosis not present

## 2016-03-26 DIAGNOSIS — O864 Pyrexia of unknown origin following delivery: Secondary | ICD-10-CM | POA: Diagnosis present

## 2016-03-26 DIAGNOSIS — Z98891 History of uterine scar from previous surgery: Secondary | ICD-10-CM

## 2016-03-26 HISTORY — DX: Other specified health status: Z78.9

## 2016-03-26 SURGERY — Surgical Case
Anesthesia: Spinal

## 2016-03-26 MED ORDER — FENTANYL CITRATE (PF) 100 MCG/2ML IJ SOLN: 25.0000 ug | INTRAMUSCULAR | Status: DC | PRN

## 2016-03-26 MED ORDER — OXYCODONE HCL 5 MG PO TABS: 10.0000 mg | ORAL_TABLET | ORAL | Status: DC | PRN

## 2016-03-26 MED ORDER — NALBUPHINE HCL 10 MG/ML IJ SOLN: 5.0000 mg | INTRAMUSCULAR | Status: DC | PRN

## 2016-03-26 MED ORDER — SODIUM CHLORIDE 0.9 % IR SOLN
Status: DC | PRN
  Administered 2016-03-26: 1000 mL

## 2016-03-26 MED ORDER — ONDANSETRON HCL 4 MG/2ML IJ SOLN: 4.0000 mg | Freq: Three times a day (TID) | INTRAMUSCULAR | Status: DC | PRN

## 2016-03-26 MED ORDER — MENTHOL 3 MG MT LOZG: 1.0000 | LOZENGE | OROMUCOSAL | Status: DC | PRN

## 2016-03-26 MED ORDER — PHENYLEPHRINE 8 MG IN D5W 100 ML (0.08MG/ML) PREMIX OPTIME
INJECTION | INTRAVENOUS | Status: AC
  Filled 2016-03-26: qty 100

## 2016-03-26 MED ORDER — DIPHENHYDRAMINE HCL 25 MG PO CAPS
25.0000 mg | ORAL_CAPSULE | ORAL | Status: DC | PRN
  Filled 2016-03-26: qty 1

## 2016-03-26 MED ORDER — ACETAMINOPHEN 500 MG PO TABS: 1000.0000 mg | ORAL_TABLET | Freq: Four times a day (QID) | ORAL | Status: DC

## 2016-03-26 MED ORDER — LACTATED RINGERS IV SOLN
INTRAVENOUS | Status: DC | PRN
  Administered 2016-03-26: 10:00:00 via INTRAVENOUS

## 2016-03-26 MED ORDER — DEXTROSE 5 % IV SOLN
1.0000 ug/kg/h | INTRAVENOUS | Status: DC | PRN
  Filled 2016-03-26: qty 2

## 2016-03-26 MED ORDER — OXYCODONE HCL 5 MG/5ML PO SOLN: 5.0000 mg | Freq: Once | ORAL | Status: DC | PRN

## 2016-03-26 MED ORDER — SIMETHICONE 80 MG PO CHEW
80.0000 mg | CHEWABLE_TABLET | Freq: Three times a day (TID) | ORAL | Status: DC
  Administered 2016-03-26 – 2016-03-28 (×5): 80 mg via ORAL
  Filled 2016-03-26 (×5): qty 1

## 2016-03-26 MED ORDER — MORPHINE SULFATE (PF) 0.5 MG/ML IJ SOLN
INTRAMUSCULAR | Status: DC | PRN
  Administered 2016-03-26: .2 mg via INTRATHECAL

## 2016-03-26 MED ORDER — COCONUT OIL OIL
1.0000 "application " | TOPICAL_OIL | Status: DC | PRN
  Filled 2016-03-26: qty 120

## 2016-03-26 MED ORDER — OXYTOCIN 10 UNIT/ML IJ SOLN
INTRAMUSCULAR | Status: AC
  Filled 2016-03-26: qty 4

## 2016-03-26 MED ORDER — KETOROLAC TROMETHAMINE 30 MG/ML IJ SOLN: 30.0000 mg | Freq: Four times a day (QID) | INTRAMUSCULAR | Status: DC | PRN

## 2016-03-26 MED ORDER — PHENYLEPHRINE 8 MG IN D5W 100 ML (0.08MG/ML) PREMIX OPTIME
INJECTION | INTRAVENOUS | Status: DC | PRN
  Administered 2016-03-26: 60 ug/min via INTRAVENOUS

## 2016-03-26 MED ORDER — NALBUPHINE HCL 10 MG/ML IJ SOLN: 5.0000 mg | Freq: Once | INTRAMUSCULAR | Status: DC | PRN

## 2016-03-26 MED ORDER — BUPIVACAINE IN DEXTROSE 0.75-8.25 % IT SOLN
INTRATHECAL | Status: DC | PRN
  Administered 2016-03-26: 1.8 mL via INTRATHECAL

## 2016-03-26 MED ORDER — IBUPROFEN 600 MG PO TABS
600.0000 mg | ORAL_TABLET | Freq: Four times a day (QID) | ORAL | Status: DC
  Administered 2016-03-26 – 2016-03-29 (×11): 600 mg via ORAL
  Filled 2016-03-26 (×11): qty 1

## 2016-03-26 MED ORDER — FENTANYL CITRATE (PF) 100 MCG/2ML IJ SOLN
INTRAMUSCULAR | Status: DC | PRN
  Administered 2016-03-26: 20 ug via INTRATHECAL

## 2016-03-26 MED ORDER — MEPERIDINE HCL 25 MG/ML IJ SOLN: 6.2500 mg | INTRAMUSCULAR | Status: DC | PRN

## 2016-03-26 MED ORDER — PROMETHAZINE HCL 25 MG/ML IJ SOLN: 6.2500 mg | INTRAMUSCULAR | Status: DC | PRN

## 2016-03-26 MED ORDER — OXYCODONE HCL 5 MG PO TABS
5.0000 mg | ORAL_TABLET | ORAL | Status: DC | PRN
  Filled 2016-03-26: qty 1

## 2016-03-26 MED ORDER — OXYCODONE HCL 5 MG PO TABS
5.0000 mg | ORAL_TABLET | Freq: Once | ORAL | Status: DC | PRN
Start: 2016-03-26 — End: 2016-03-26

## 2016-03-26 MED ORDER — SCOPOLAMINE 1 MG/3DAYS TD PT72
1.0000 | MEDICATED_PATCH | Freq: Once | TRANSDERMAL | Status: DC
  Administered 2016-03-26: 1.5 mg via TRANSDERMAL

## 2016-03-26 MED ORDER — CEFOTETAN DISODIUM 2 G IJ SOLR: INTRAMUSCULAR | Status: DC | PRN

## 2016-03-26 MED ORDER — PRENATAL MULTIVITAMIN CH
1.0000 | ORAL_TABLET | Freq: Every day | ORAL | Status: DC
  Administered 2016-03-28: 1 via ORAL
  Filled 2016-03-26 (×2): qty 1

## 2016-03-26 MED ORDER — MORPHINE SULFATE-NACL 0.5-0.9 MG/ML-% IV SOSY
PREFILLED_SYRINGE | INTRAVENOUS | Status: AC
  Filled 2016-03-26: qty 1

## 2016-03-26 MED ORDER — LACTATED RINGERS IV SOLN
Freq: Once | INTRAVENOUS | Status: AC
  Administered 2016-03-26: 08:00:00 via INTRAVENOUS

## 2016-03-26 MED ORDER — DIPHENHYDRAMINE HCL 50 MG/ML IJ SOLN: 12.5000 mg | INTRAMUSCULAR | Status: DC | PRN

## 2016-03-26 MED ORDER — KETOROLAC TROMETHAMINE 30 MG/ML IJ SOLN
INTRAMUSCULAR | Status: AC
  Filled 2016-03-26: qty 1

## 2016-03-26 MED ORDER — OXYTOCIN 10 UNIT/ML IJ SOLN
INTRAVENOUS | Status: DC | PRN
  Administered 2016-03-26: 40 [IU] via INTRAVENOUS

## 2016-03-26 MED ORDER — ONDANSETRON HCL 4 MG/2ML IJ SOLN
INTRAMUSCULAR | Status: DC | PRN
  Administered 2016-03-26: 4 mg via INTRAVENOUS

## 2016-03-26 MED ORDER — MEDROXYPROGESTERONE ACETATE 150 MG/ML IM SUSP: 150.0000 mg | INTRAMUSCULAR | Status: DC | PRN

## 2016-03-26 MED ORDER — LACTATED RINGERS IV SOLN
INTRAVENOUS | Status: DC
  Administered 2016-03-26 (×3): via INTRAVENOUS

## 2016-03-26 MED ORDER — CEFAZOLIN SODIUM-DEXTROSE 2-3 GM-% IV SOLR
INTRAVENOUS | Status: DC | PRN
  Administered 2016-03-26: 2 g via INTRAVENOUS

## 2016-03-26 MED ORDER — FENTANYL CITRATE (PF) 100 MCG/2ML IJ SOLN
INTRAMUSCULAR | Status: AC
  Filled 2016-03-26: qty 2

## 2016-03-26 MED ORDER — SCOPOLAMINE 1 MG/3DAYS TD PT72: 1.0000 | MEDICATED_PATCH | Freq: Once | TRANSDERMAL | Status: DC

## 2016-03-26 MED ORDER — LACTATED RINGERS IV SOLN
INTRAVENOUS | Status: DC
  Administered 2016-03-26 – 2016-03-27 (×2): via INTRAVENOUS

## 2016-03-26 MED ORDER — SCOPOLAMINE 1 MG/3DAYS TD PT72
MEDICATED_PATCH | TRANSDERMAL | Status: AC
  Administered 2016-03-26: 1.5 mg via TRANSDERMAL
  Filled 2016-03-26: qty 1

## 2016-03-26 MED ORDER — NALOXONE HCL 0.4 MG/ML IJ SOLN: 0.4000 mg | INTRAMUSCULAR | Status: DC | PRN

## 2016-03-26 MED ORDER — ONDANSETRON HCL 4 MG/2ML IJ SOLN
INTRAMUSCULAR | Status: AC
  Filled 2016-03-26: qty 2

## 2016-03-26 MED ORDER — CEFAZOLIN SODIUM-DEXTROSE 2-4 GM/100ML-% IV SOLN
INTRAVENOUS | Status: AC
  Filled 2016-03-26: qty 100

## 2016-03-26 MED ORDER — KETOROLAC TROMETHAMINE 30 MG/ML IJ SOLN
30.0000 mg | Freq: Four times a day (QID) | INTRAMUSCULAR | Status: DC | PRN
  Administered 2016-03-26: 30 mg via INTRAMUSCULAR

## 2016-03-26 MED ORDER — SODIUM CHLORIDE 0.9% FLUSH: 3.0000 mL | INTRAVENOUS | Status: DC | PRN

## 2016-03-26 SURGICAL SUPPLY — 35 items
APL SKNCLS STERI-STRIP NONHPOA (GAUZE/BANDAGES/DRESSINGS) ×1
BENZOIN TINCTURE PRP APPL 2/3 (GAUZE/BANDAGES/DRESSINGS) ×2 IMPLANT
CHLORAPREP W/TINT 26ML (MISCELLANEOUS) ×2 IMPLANT
CLAMP CORD UMBIL (MISCELLANEOUS) IMPLANT
CLOTH BEACON ORANGE TIMEOUT ST (SAFETY) ×2 IMPLANT
CONTAINER PREFILL 10% NBF 15ML (MISCELLANEOUS) IMPLANT
DRAIN JACKSON PRT FLT 10 (DRAIN) IMPLANT
DRSG OPSITE POSTOP 4X10 (GAUZE/BANDAGES/DRESSINGS) ×2 IMPLANT
ELECT REM PT RETURN 9FT ADLT (ELECTROSURGICAL) ×2
ELECTRODE REM PT RTRN 9FT ADLT (ELECTROSURGICAL) ×1 IMPLANT
EVACUATOR SILICONE 100CC (DRAIN) IMPLANT
EXTRACTOR VACUUM M CUP 4 TUBE (SUCTIONS) IMPLANT
GLOVE BIO SURGEON STRL SZ 6.5 (GLOVE) ×2 IMPLANT
GLOVE BIOGEL PI IND STRL 7.0 (GLOVE) ×2 IMPLANT
GLOVE BIOGEL PI INDICATOR 7.0 (GLOVE) ×2
GOWN STRL REUS W/TWL LRG LVL3 (GOWN DISPOSABLE) ×4 IMPLANT
KIT ABG SYR 3ML LUER SLIP (SYRINGE) IMPLANT
NDL HYPO 25X5/8 SAFETYGLIDE (NEEDLE) IMPLANT
NEEDLE HYPO 25X5/8 SAFETYGLIDE (NEEDLE) IMPLANT
NS IRRIG 1000ML POUR BTL (IV SOLUTION) ×2 IMPLANT
PACK C SECTION WH (CUSTOM PROCEDURE TRAY) ×2 IMPLANT
PAD OB MATERNITY 4.3X12.25 (PERSONAL CARE ITEMS) ×2 IMPLANT
PENCIL SMOKE EVAC W/HOLSTER (ELECTROSURGICAL) ×2 IMPLANT
RTRCTR C-SECT PINK 25CM LRG (MISCELLANEOUS) IMPLANT
STRIP CLOSURE SKIN 1/2X4 (GAUZE/BANDAGES/DRESSINGS) ×2 IMPLANT
SUT CHROMIC 0 CT 1 (SUTURE) ×2 IMPLANT
SUT MNCRL AB 3-0 PS2 27 (SUTURE) ×2 IMPLANT
SUT PLAIN 2 0 (SUTURE) ×4
SUT PLAIN 2 0 XLH (SUTURE) ×2 IMPLANT
SUT PLAIN ABS 2-0 CT1 27XMFL (SUTURE) ×2 IMPLANT
SUT SILK 2 0 SH (SUTURE) IMPLANT
SUT VIC AB 0 CTX 36 (SUTURE) ×8
SUT VIC AB 0 CTX36XBRD ANBCTRL (SUTURE) ×4 IMPLANT
TOWEL OR 17X24 6PK STRL BLUE (TOWEL DISPOSABLE) ×2 IMPLANT
TRAY FOLEY CATH SILVER 14FR (SET/KITS/TRAYS/PACK) ×2 IMPLANT

## 2016-03-26 NOTE — Anesthesia Procedure Notes (Signed)
Spinal  Start time: 03/26/2016 9:47 AM End time: 03/26/2016 9:50 AM Staffing Anesthesiologist: Bonita QuinGUIDETTI, RICHARD S Performed: anesthesiologist  Preanesthetic Checklist Completed: patient identified, site marked, surgical consent, pre-op evaluation, timeout performed, IV checked, risks and benefits discussed and monitors and equipment checked Spinal Block Patient position: sitting Prep: DuraPrep Patient monitoring: heart rate, cardiac monitor, continuous pulse ox and blood pressure Approach: midline Location: L4-5 Injection technique: single-shot Needle Needle type: Pencil-Tip  Needle gauge: 25 G Assessment Sensory level: T10

## 2016-03-26 NOTE — Plan of Care (Signed)
Problem: Education: Goal: Knowledge of condition will improve Admission paperwork, safety and hospital policies reviewed with patient and family member. Patient verbalizes understanding.

## 2016-03-26 NOTE — H&P (Signed)
Stacey Guzman is a 20 y.o. female presenting for CS secondary to breech presentation. OB History    Gravida Para Term Preterm AB Living   1             SAB TAB Ectopic Multiple Live Births                 Past Medical History:  Diagnosis Date  . Medical history non-contributory    Past Surgical History:  Procedure Laterality Date  . ADENOIDECTOMY    . TONSILLECTOMY     Family History: family history includes Hypertension in her maternal grandfather and paternal grandfather. Social History:  reports that she has never smoked. She has never used smokeless tobacco. She reports that she drinks alcohol. She reports that she uses drugs, including Marijuana.     Maternal Diabetes: No Genetic Screening: Normal Maternal Ultrasounds/Referrals: Normal Fetal Ultrasounds or other Referrals:  None Maternal Substance Abuse:  No Significant Maternal Medications:  None Significant Maternal Lab Results:  None Other Comments:  None    ROS History   Blood pressure 115/78, pulse 95, temperature 97.9 F (36.6 C), temperature source Oral, resp. rate 16, height 5\' 4"  (1.626 m), weight 158 lb (71.7 kg), last menstrual period 06/24/2015, SpO2 99 %. Exam Physical Exam  Physical Examination: General appearance - alert, well appearing, and in no distress Chest - clear to auscultation, no wheezes, rales or rhonchi, symmetric air entry Heart - normal rate and regular rhythm Abdomen - soft, nontender, nondistended, no masses or organomegaly gravid Extremities - peripheral pulses normal, no pedal edema, no clubbing or cyanosis, Homan's sign negative bilaterally Prenatal labs: ABO, Rh: --/--/O POS, O POS (10/27 1105) Antibody: NEG (10/27 1105) Rubella: Immune (04/13 0000) RPR: Non Reactive (10/27 1105)  HBsAg: Negative (04/13 0000)  HIV: Non-reactive (04/13 0000)  GBS:     Assessment/Plan: Term Breech For CS.  R&B reviewed.     Gwenevere Goga A 03/26/2016, 9:34 AM

## 2016-03-26 NOTE — Transfer of Care (Signed)
Immediate Anesthesia Transfer of Care Note  Patient: Stacey Guzman  Procedure(s) Performed: Procedure(s) with comments: PRIMARY CESAREAN SECTION (N/A) - Provider requests RNFA -ap  Patient Location: PACU  Anesthesia Type:Spinal  Level of Consciousness: awake, alert , oriented and patient cooperative  Airway & Oxygen Therapy: Patient Spontanous Breathing  Post-op Assessment: Report given to RN and Post -op Vital signs reviewed and stable  Post vital signs: Reviewed and stable  Last Vitals:  Vitals:   02/21/16 2005 03/26/16 0741  BP: 118/78 115/78  Pulse: (!) 125 95  Resp: 18 16  Temp: 37.9 C 36.6 C    Last Pain:  Vitals:   03/26/16 0741  TempSrc: Oral  PainSc:       Patients Stated Pain Goal: 3 (03/26/16 0741)  Complications: No apparent anesthesia complications

## 2016-03-26 NOTE — Lactation Note (Signed)
This note was copied from a baby's chart. Lactation Consultation Note  Patient Name: Girl Larey SeatDestony Gatchalian [id number]'s Date: 03/26/2016 Reason for consult: Initial assessment Baby at 5 hr of life. Mom denies breast or nipple pain. She seems unsure that baby is "getting enough". Discussed baby behavior, feeding frequency, baby belly size, voids, wt loss, breast changes, and nipple care. Demonstrated manual expression, colostrum noted bilaterally, spoons in room. Given lactation handouts. Aware of OP services and support group. Mom to call as needed.     Maternal Data Has patient been taught Hand Expression?: Yes Does the patient have breastfeeding experience prior to this delivery?: No  Feeding Feeding Type: Breast Fed Length of feed: 15 min  LATCH Score/Interventions Latch: Repeated attempts needed to sustain latch, nipple held in mouth throughout feeding, stimulation needed to elicit sucking reflex. Intervention(s): Adjust position;Assist with latch;Breast massage;Breast compression  Audible Swallowing: A few with stimulation Intervention(s): Skin to skin;Hand expression  Type of Nipple: Everted at rest and after stimulation  Comfort (Breast/Nipple): Soft / non-tender     Hold (Positioning): Assistance needed to correctly position infant at breast and maintain latch.  LATCH Score: 7  Lactation Tools Discussed/Used WIC Program: Yes   Consult Status Consult Status: Follow-up Date: 03/27/16 Follow-up type: In-patient    Rulon Eisenmengerlizabeth E Jillyan Plitt 03/26/2016, 3:46 PM

## 2016-03-26 NOTE — Anesthesia Postprocedure Evaluation (Signed)
Anesthesia Post Note  Patient: Larey SeatDestony Rhue  Procedure(s) Performed: Procedure(s) (LRB): PRIMARY CESAREAN SECTION (N/A)  Patient location during evaluation: Mother Baby Anesthesia Type: Spinal Level of consciousness: awake and alert Pain management: pain level controlled Vital Signs Assessment: post-procedure vital signs reviewed and stable Respiratory status: spontaneous breathing and nonlabored ventilation Cardiovascular status: stable Postop Assessment: patient able to bend at knees, no headache, no backache, no signs of nausea or vomiting, adequate PO intake and spinal receding Anesthetic complications: no     Last Vitals:  Vitals:   03/26/16 1345 03/26/16 1445  BP: (!) 112/59 113/62  Pulse: 82 82  Resp: 18 16  Temp: 36.9 C 36.8 C    Last Pain:  Vitals:   03/26/16 1445  TempSrc: Oral  PainSc: 1    Pain Goal: Patients Stated Pain Goal: 3 (03/26/16 1445)               Geneveive Furness Hristova

## 2016-03-26 NOTE — Addendum Note (Signed)
Addendum  created 03/26/16 1612 by Elgie CongoNataliya H Somtochukwu Woollard, CRNA   Sign clinical note

## 2016-03-26 NOTE — Anesthesia Postprocedure Evaluation (Signed)
Anesthesia Post Note  Patient: Larey SeatDestony Chandra  Procedure(s) Performed: Procedure(s) (LRB): PRIMARY CESAREAN SECTION (N/A)  Patient location during evaluation: PACU Anesthesia Type: Spinal Level of consciousness: oriented and awake and alert Pain management: pain level controlled Vital Signs Assessment: post-procedure vital signs reviewed and stable Respiratory status: spontaneous breathing, respiratory function stable and patient connected to nasal cannula oxygen Cardiovascular status: blood pressure returned to baseline and stable Postop Assessment: no headache and no backache Anesthetic complications: no     Last Vitals:  Vitals:   03/26/16 1345 03/26/16 1445  BP: (!) 112/59 113/62  Pulse: 82 82  Resp: 18 16  Temp: 36.9 C 36.8 C    Last Pain:  Vitals:   03/26/16 1445  TempSrc: Oral  PainSc: 1    Pain Goal: Patients Stated Pain Goal: 3 (03/26/16 1445)               Bonita Quinichard S Lizeth Bencosme

## 2016-03-26 NOTE — Anesthesia Preprocedure Evaluation (Signed)
Anesthesia Evaluation  Patient identified by MRN, date of birth, ID band Patient awake    Reviewed: Allergy & Precautions, NPO status , Patient's Chart, lab work & pertinent test results  Airway Mallampati: II  TM Distance: >3 FB Neck ROM: Full    Dental no notable dental hx.    Pulmonary neg pulmonary ROS,    Pulmonary exam normal        Cardiovascular negative cardio ROS Normal cardiovascular exam     Neuro/Psych negative neurological ROS  negative psych ROS   GI/Hepatic negative GI ROS, Neg liver ROS,   Endo/Other  negative endocrine ROS  Renal/GU negative Renal ROS  negative genitourinary   Musculoskeletal negative musculoskeletal ROS (+)   Abdominal   Peds negative pediatric ROS (+)  Hematology negative hematology ROS (+)   Anesthesia Other Findings   Reproductive/Obstetrics (+) Pregnancy                             Anesthesia Physical Anesthesia Plan  ASA: II  Anesthesia Plan: Spinal   Post-op Pain Management:    Induction:   Airway Management Planned:   Additional Equipment:   Intra-op Plan:   Post-operative Plan:   Informed Consent:   Plan Discussed with: CRNA, Anesthesiologist and Surgeon  Anesthesia Plan Comments:         Anesthesia Quick Evaluation

## 2016-03-26 NOTE — Op Note (Signed)
Cesarean Section Procedure Note   Stacey Guzman  03/26/2016  Indications: Breech Presentation   Pre-operative Diagnosis: Breech Presentation.   Post-operative Diagnosis: Same   Surgeon: Surgeon(s) and Role:    * Jaymes GraffNaima Tylen Leverich, MD - Primary   Assistants: none   Anesthesia: spinal   Procedure Details:  The patient was seen in the Holding Room. The risks, benefits, complications, treatment options, and expected outcomes were discussed with the patient. The patient concurred with the proposed plan, giving informed consent. identified as Stacey Seatestony Neff and the procedure verified as C-Section Delivery. A Time Out was held and the above information confirmed.  After induction of anesthesia, the patient was draped and prepped in the usual sterile manner. A transverse incision was made and carried down through the subcutaneous tissue to the fascia. Fascial incision was made in the midline and extended transversely. The fascia was separated from the underlying rectus muscle superiorly and inferiorly. The peritoneum was identified and entered. Peritoneal incision was extended longitudinally with good visualization of bowel and bladder. The utero-vesical peritoneal reflection was incised transversely and the bladder flap was bluntly freed from the lower uterine segment.  An alexsis retractor was placed in the abdomen.   A low transverse uterine incision was made. Delivered from breech frank presentation was a  infant, with Apgar scores of 8 at one minute and 9 at five minutes. Cord ph was not sent the umbilical cord was clamped and cut cord blood was obtained for evaluation. The placenta was removed Intact and appeared normal. The uterine outline, tubes and ovaries appeared normal}. The uterine incision was closed with running locked sutures of 0Vicryl. A second layer 0 vicrlyl was used to imbricate the uterine incision    Hemostasis was observed. Lavage was carried out until clear. The alexsis was removed.   The peritoneum was closed with 0 chromic.  The muscles were examined and any bleeders were made hemostatic using bovie cautery device.   The fascia was then reapproximated with running sutures of 0 vicryl.  The subcutaneous tissue was reapproximated  With interrupted stitches using 2-0 plain gut. The subcuticular closure was performed using 3-660monocryl     Instrument, sponge, and needle counts were correct prior the abdominal closure and were correct at the conclusion of the case.    Findings: infant was delivered from frank breech presentation. The fluid was clear.  The uterus tubes and ovaries appeared normal.     Estimated Blood Loss: 400cc   Total IV Fluids: 2600ml   Urine Output: 500CC OF clear urine  Specimens: placenta to L&D for donation  Complications: no complications  Disposition: PACU - hemodynamically stable.   Maternal Condition: stable   Baby condition / location:  Couplet care / Skin to Skin  Attending Attestation: I performed the procedure.   Signed: Surgeon(s): Jaymes GraffNaima Tabbitha Janvrin, MD

## 2016-03-27 ENCOUNTER — Encounter (HOSPITAL_COMMUNITY): Admission: RE | Admit: 2016-03-27 | Payer: Medicaid Other | Source: Ambulatory Visit

## 2016-03-27 DIAGNOSIS — O864 Pyrexia of unknown origin following delivery: Secondary | ICD-10-CM

## 2016-03-27 LAB — CBC WITH DIFFERENTIAL/PLATELET
BASOS ABS: 0 10*3/uL (ref 0.0–0.1)
BLASTS: 0 %
Band Neutrophils: 0 %
Basophils Absolute: 0 10*3/uL (ref 0.0–0.1)
Basophils Relative: 0 %
Basophils Relative: 0 %
EOS PCT: 1 %
Eosinophils Absolute: 0.1 10*3/uL (ref 0.0–0.7)
Eosinophils Absolute: 0 10*3/uL (ref 0.0–0.7)
Eosinophils Relative: 0 %
HCT: 29.5 % — ABNORMAL LOW (ref 36.0–46.0)
HEMATOCRIT: 33.8 % — AB (ref 36.0–46.0)
Hemoglobin: 10.1 g/dL — ABNORMAL LOW (ref 12.0–15.0)
Hemoglobin: 11.5 g/dL — ABNORMAL LOW (ref 12.0–15.0)
LYMPHS PCT: 7 %
Lymphocytes Relative: 9 %
Lymphs Abs: 1.7 10*3/uL (ref 0.7–4.0)
Lymphs Abs: 1.8 10*3/uL (ref 0.7–4.0)
MCH: 29.4 pg (ref 26.0–34.0)
MCH: 29.3 pg (ref 26.0–34.0)
MCHC: 34.2 g/dL (ref 30.0–36.0)
MCHC: 34 g/dL (ref 30.0–36.0)
MCV: 85.8 fL (ref 78.0–100.0)
MCV: 86.2 fL (ref 78.0–100.0)
MONO ABS: 1.5 10*3/uL — AB (ref 0.1–1.0)
Metamyelocytes Relative: 0 %
Monocytes Absolute: 1.8 10*3/uL — ABNORMAL HIGH (ref 0.1–1.0)
Monocytes Relative: 8 %
Monocytes Relative: 7 %
Myelocytes: 0 %
NEUTROS ABS: 16.3 10*3/uL — AB (ref 1.7–7.7)
NEUTROS PCT: 86 %
NRBC: 0 /100{WBCs}
Neutro Abs: 21.9 10*3/uL — ABNORMAL HIGH (ref 1.7–7.7)
Neutrophils Relative %: 82 %
OTHER: 0 %
PLATELETS: 212 10*3/uL (ref 150–400)
PROMYELOCYTES ABS: 0 %
Platelets: 222 10*3/uL (ref 150–400)
RBC: 3.44 MIL/uL — ABNORMAL LOW (ref 3.87–5.11)
RBC: 3.92 MIL/uL (ref 3.87–5.11)
RDW: 14.1 % (ref 11.5–15.5)
RDW: 14.2 % (ref 11.5–15.5)
WBC: 19.6 10*3/uL — ABNORMAL HIGH (ref 4.0–10.5)
WBC: 25.5 10*3/uL — AB (ref 4.0–10.5)

## 2016-03-27 LAB — BIRTH TISSUE RECOVERY COLLECTION (PLACENTA DONATION)

## 2016-03-27 MED ORDER — SODIUM CHLORIDE 0.9 % IV SOLN
3.0000 g | Freq: Four times a day (QID) | INTRAVENOUS | Status: AC
  Administered 2016-03-27 – 2016-03-28 (×5): 3 g via INTRAVENOUS
  Filled 2016-03-27 (×6): qty 3

## 2016-03-27 MED ORDER — SENNOSIDES-DOCUSATE SODIUM 8.6-50 MG PO TABS
2.0000 | ORAL_TABLET | ORAL | Status: DC
  Administered 2016-03-27 – 2016-03-28 (×2): 2 via ORAL
  Filled 2016-03-27 (×2): qty 2

## 2016-03-27 MED ORDER — ACETAMINOPHEN 325 MG PO TABS
650.0000 mg | ORAL_TABLET | Freq: Once | ORAL | Status: AC
Start: 2016-03-27 — End: 2016-03-27
  Administered 2016-03-27: 650 mg via ORAL
  Filled 2016-03-27: qty 2

## 2016-03-27 NOTE — Progress Notes (Signed)
MD dillard notified regarding fever and inc pulse. See new orders.

## 2016-03-27 NOTE — Progress Notes (Signed)
Stacey Guzman, Stacey Guzman Female, 20 y.o., [birth year]  Subjective: Postpartum Day 1: Cesarean Delivery Patient reports feeling tired.  She is tolerating regular diet, with flatus passage, ambulating without difficulty.  Foley catheter just removed, no void yet.  Scant lochia. Pain well controlled.    Objective: Vital signs in last 24 hours: Temp:  [97.7 F (36.5 C)-98.8 F (37.1 C)] 98.3 F (36.8 C) (10/31 0350) Pulse Rate:  [74-117] 79 (10/31 0350) Resp:  [9-24] 16 (10/31 0350) BP: (101-141)/(59-98) 108/63 (10/31 0350) SpO2:  [94 %-99 %] 97 % (10/31 0350)  Physical Exam:  General: alert, cooperative and no distress Lochia: appropriate Uterine Fundus: firm Incision: no significant drainage DVT Evaluation: No evidence of DVT seen on physical exam.  No edema in extremities.    CBC Latest Ref Rng & Units 03/27/2016 03/23/2016 02/21/2016  WBC 4.0 - 10.5 K/uL 19.6(H) 16.0(H) 14.6(H)  Hemoglobin 12.0 - 15.0 g/dL 10.1(L) 11.6(L) 11.4(L)  Hematocrit 36.0 - 46.0 % 29.5(L) 33.6(L) 34.1(L)  Platelets 150 - 400 K/uL 212 240 180   Scheduled medication:   . ibuprofen  600 mg Oral Q6H  . prenatal multivitamin  1 tablet Oral Q1200  . simethicone  80 mg Oral TID PC    Assessment/Plan: Status post Cesarean section. Doing well postoperatively.  Continue current care. C/w prenatal vitamin tabs.  Out of bed and ambulation Voiding trial Rest encouraged.  Konrad FelixKULWA,Wahneta Derocher WAKURU, MD.  03/27/2016, 8:11 AM

## 2016-03-27 NOTE — Lactation Note (Signed)
This note was copied from a baby's chart. Lactation Consultation Note  [name] reports that BF is going well. She denies any pain or concerns.  Reviewed appropriate output patterns for DOL.  States ability to hand express.  Patient Name: Stacey Guzman [id number]'s Date: 03/27/2016 Reason for consult: Follow-up assessment   Maternal Data    Feeding Feeding Type: Breast Fed Length of feed: 15 min  LATCH Score/Interventions                      Lactation Tools Discussed/Used     Consult Status Consult Status: Follow-up Date: 03/28/16 Follow-up type: In-patient    Stacey Guzman, Stacey Guzman 03/27/2016, 5:15 PM

## 2016-03-27 NOTE — Plan of Care (Signed)
Problem: Life Cycle: Goal: Risk for postpartum hemorrhage will decrease Outcome: Progressing Pt continues to be on LR @ 125. Small amount of rubia lochia present. Fundus firm u/2. Vitals WDL. Will continue to monitor.  Goal: Chance of risk for complications during the postpartum period will decrease Outcome: Progressing Vitals WDL. Pt has no pain at this time - is able to ambulate independently in room. LR infusing @ 125 into patent Iv. Foley in place with good output. Will continue to monitor.   Problem: Pain Management: Goal: General experience of comfort will improve and pain level will decrease Outcome: Progressing Pt has no pain at this time. Scheduled motrin given per MAR.   Problem: Bowel/Gastric: Goal: Gastrointestinal status will improve Outcome: Progressing Pt states she is passing gas - has not passed bowel movement since C/S at this time.   Problem: Respiratory: Goal: Ability to maintain adequate ventilation will improve Outcome: Completed/Met Date Met: 03/27/16 Pt has clear lung sounds throughout. Respiratory rate and Oximetry WDL. No cough noted.   Problem: Skin Integrity: Goal: Demonstration of wound healing without infection will improve Outcome: Progressing Pt abdominal incision covered with honeycomb dressing and pressure dressing. Order to change once this evening due to bottom edge coming loose. Small amounts of drainage noted.   Problem: Urinary Elimination: Goal: Ability to reestablish a normal urinary elimination pattern will improve Outcome: Progressing Pt foley in place. Pt has LR @ 132m/hr. Pt has good urinary output. Continues to tolerate fluids orally well.
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
## 2016-03-27 NOTE — Progress Notes (Signed)
UR chart review completed.  

## 2016-03-27 NOTE — Progress Notes (Signed)
CSW acknowledges consult.  CSW attempted to meet with [name], however [name] had several room guest.  CSW will attempt to visit with [name] at a later time.   [name], MSW, LCSW Clinical Social Work [phone]  

## 2016-03-27 NOTE — Lactation Note (Signed)
This note was copied from a baby's chart. Lactation Consultation Note Minimal assist with positioning. Encouraged mother to use breast compression to aid in transfer.  Patient Name: Stacey Guzman LKGMW'NToday's Date: 03/27/2016     Maternal Data    Feeding Feeding Type: Breast Fed Length of feed: 10 min  LATCH Score/Interventions Latch: Repeated attempts needed to sustain latch, nipple held in mouth throughout feeding, stimulation needed to elicit sucking reflex.  Audible Swallowing: A few with stimulation  Type of Nipple: Everted at rest and after stimulation  Comfort (Breast/Nipple): Soft / non-tender     Hold (Positioning): Assistance needed to correctly position infant at breast and maintain latch.  LATCH Score: 7  Lactation Tools Discussed/Used     Consult Status      Soyla DryerJoseph, Samer Dutton 03/27/2016, 10:43 PM

## 2016-03-27 NOTE — Progress Notes (Addendum)
S: In to examine pt s/p 101.2 temp, and pulse of 130 bpm at 18:43 this evening. States tired and feels "feverish". Has only ambulated in room. Not drinking much. Pain well controlled. + flatus.  O:  Today's Vitals   03/27/16 0350 03/27/16 0745 03/27/16 1738 03/27/16 1843  BP: 108/63 110/73 (!) 132/97 124/84  Pulse: 79 75 (!) 109 (!) 130  Resp: 16 18 (!) 22 20  Temp: 98.3 F (36.8 C) 98.3 F (36.8 C) 99.7 F (37.6 C) (!) 101.2 F (38.4 C)  TempSrc: Oral Oral Oral Oral  SpO2: 97%     Weight:      Height:      PainSc: 0-No pain 0-No pain     Pulse taken manually and noted to be 104 bpm  Gen: Appears fatigued. Skin: Hot - generalized. Breasts: soft, non-painful, no erythema or inflammation. Lungs: CTAB. CV: RRR w/o M/R/G. Abdomen: soft, appropriately tender, ND, +BS.  Incision: No obvious cellulitis, erythema, induration or purulent drainage. Fundus: firm @ U-2, appropriately tender. Lochia: Scant rubra, no odor. Ext: Neg Homan's bilaterally.  A: 20 yo G1 now P1, s/p uncomplicated primary c-section 2/2 breech presentation, POD 1. Fever of unknown origin - exam benign Maternal tachycardia, suspect dehydration Normal involution  P: Tylenol, blood cultures, CBC, UA and urine culture ordered by Dr. Normand Sloopillard. Encouraged ambulation in hall and increased water intake.  Sherre ScarletKimberly Monigue Spraggins, CNM 03/27/16, 7:18 PM  ADDENDUM: Notified Dr. Normand Sloopillard re: PE. To proceed w/ incentive spirometer and IV Unasyn.    Sherre ScarletKimberly Katalia Choma, CNM 03/27/16, 8:30 PM

## 2016-03-28 NOTE — Progress Notes (Signed)
Subjective: Postpartum Day 2: Cesarean Delivery Patient reports + flatus.  Tolerating po, ambulating well, no nausea.  Objective: Vital signs in last 24 hours: Temp:  [97.7 F (36.5 C)-101.2 F (38.4 C)] 97.7 F (36.5 C) (11/01 0827) Pulse Rate:  [78-130] 107 (11/01 0539) Resp:  [16-22] 16 (11/01 0539) BP: (113-132)/(66-97) 113/74 (11/01 0539) SpO2:  [98 %-100 %] 98 % (11/01 0245)  Physical Exam:  General: alert and no distress Lochia: appropriate Uterine Fundus: firm Incision: dressing intact c/d DVT Evaluation: No evidence of DVT seen on physical exam.   Recent Labs  03/27/16 0525 03/27/16 1913  HGB 10.1* 11.5*  HCT 29.5* 33.8*    Assessment/Plan: Status post Cesarean section. Postoperative course complicated by fever  Continue current care. Afebrile since starting unasyn and doing well Encourage IS OOB ad lib Anticipate d/c tomorrow Purcell NailsROBERTS,[name] Y 03/28/2016, 1:00 PM

## 2016-03-28 NOTE — Plan of Care (Signed)
Problem: Physical Regulation: Goal: Will remain free from infection Outcome: Progressing IV atb given per mar. Pt afebrile at this time. Will continue to monitor.

## 2016-03-29 LAB — URINE CULTURE: Culture: NO GROWTH

## 2016-03-29 MED ORDER — OXYCODONE HCL 10 MG PO TABS: 10.0000 mg | ORAL_TABLET | ORAL | 0 refills | Status: DC | PRN

## 2016-03-29 MED ORDER — IBUPROFEN 600 MG PO TABS: 600.0000 mg | ORAL_TABLET | Freq: Four times a day (QID) | ORAL | 1 refills | Status: DC

## 2016-03-29 NOTE — Lactation Note (Signed)
This note was copied from a baby's chart. Lactation Consultation Note  Mother recently breastfed for 15 min and baby is asleep on her chest. Mother states at times baby gets frustrated and will not latch. Told her to call to hlep w/ next feeding. She can always give baby pumped breastmilk if baby does not feed. Recommend she either prepump or hand express to start flow before baby latches. Reminded mother to breastfeed on both breasts per feeding and if baby is often sleepy to feed STS. Mother has been pumping w/ her own DEBP and has water in tubing from washing.  Told her how to get it out but warned it may not work so try before discharge. Mom encouraged to feed baby 8-12 times/24 hours and with feeding cues.  If baby does not wake for feedings, place her STS and hand express. Reviewed engorgement care and monitoring voids/stools.    Patient Name: Stacey Guzman ZOXWR'UToday's Date: 03/29/2016 Reason for consult: Follow-up assessment   Maternal Data    Feeding Feeding Type: Breast Fed Length of feed: 15 min  LATCH Score/Interventions                      Lactation Tools Discussed/Used     Consult Status Consult Status: Complete    Hardie PulleyBerkelhammer, Ruth Boschen 03/29/2016, 8:39 AM

## 2016-03-29 NOTE — Discharge Instructions (Signed)

## 2016-03-29 NOTE — Discharge Summary (Signed)
Obstetric Discharge Summary  Reason for Admission: cesarean section on 03/26/16 for breech presentation Prenatal Procedures: none Intrapartum Procedures: cesarean: low cervical, transverse by Dr Normand Sloopillard on 03/26/16 Postpartum Procedures: antibiotics Unasyn for 24 hours Complications-Operative and Postpartum: none  Hemoglobin  Date Value Ref Range Status  03/27/2016 11.5 (L) 12.0 - 15.0 g/dL Final   HCT  Date Value Ref Range Status  03/27/2016 33.8 (L) 36.0 - 46.0 % Final    Discharge Diagnoses: Term Pregnancy-delivered  Physical exam:   General: normal Lochia: appropriate Uterine Fundus: 0/2 firm non-tender Dressing dry and clean  Extremities: No evidence of DVT seen on physical exam. Edema minimal   Hospital course: Post-op fever requiring IV antibiotics for 24 hours with negative blood cultures  Date: 03/29/2016 Activity: driving and lifting restrictions reviewed Diet: routine Medications: Ibuprofen Condition: stable  Breastfeeding:   Yes.   Contraception:  condoms  Instructions: refer to practice specific booklet Discharge to: home   Newborn Data:   Baby female Name: Stacey Guzman  JXBJYN,WGNFAOIVARD,Shuan Statzer A MD 03/29/2016, 7:07 AM

## 2016-04-01 LAB — CULTURE, BLOOD (ROUTINE X 2)
CULTURE: NO GROWTH
Culture: NO GROWTH

## 2017-07-24 IMAGING — US US OB COMP LESS 14 WK
1 series · 14 of 28 positions shown · non-contrast
Comparison: None.

CLINICAL DATA: First trimester pregnancy, pelvic pain and
constipation

EXAM:
OBSTETRIC <14 WK US AND TRANSVAGINAL OB US
TECHNIQUE: Both transabdominal and transvaginal ultrasound examinations were
performed for complete evaluation of the gestation as well as the
maternal uterus, adnexal regions, and pelvic cul-de-sac.
Transvaginal technique was performed to assess early pregnancy.

[Series 1: us ob comp less 14 wk · 0.17mm/px · 14 of 156 slices shown]
[im 6/156]
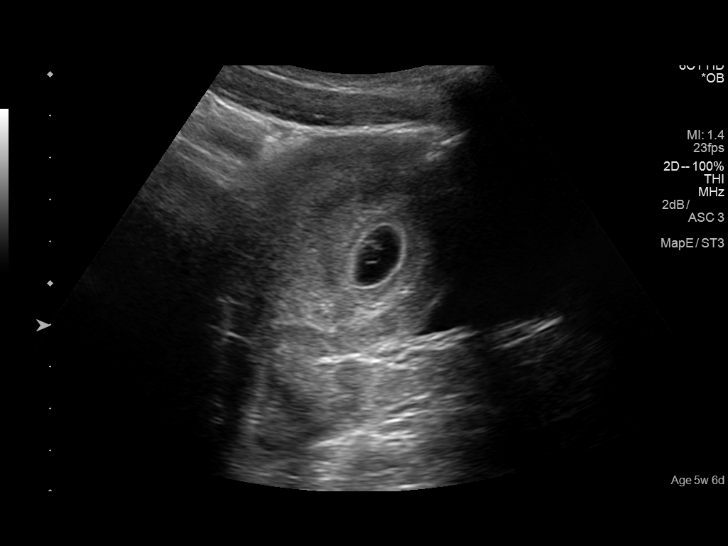
[im 18/156]
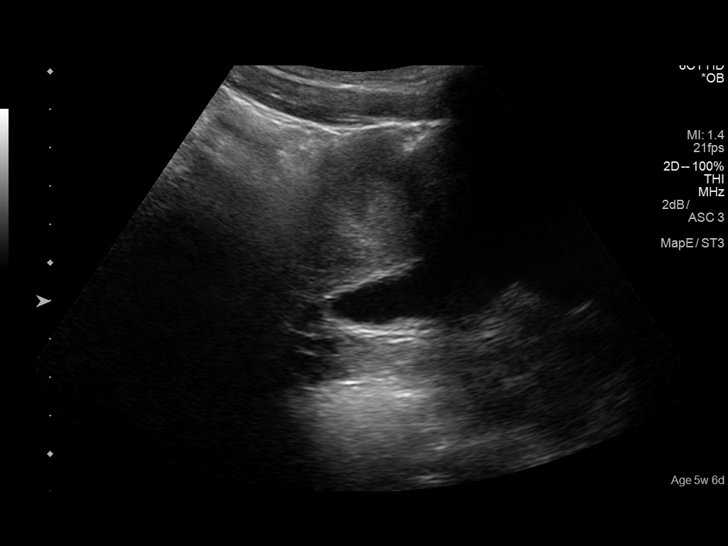
[im 29/156]
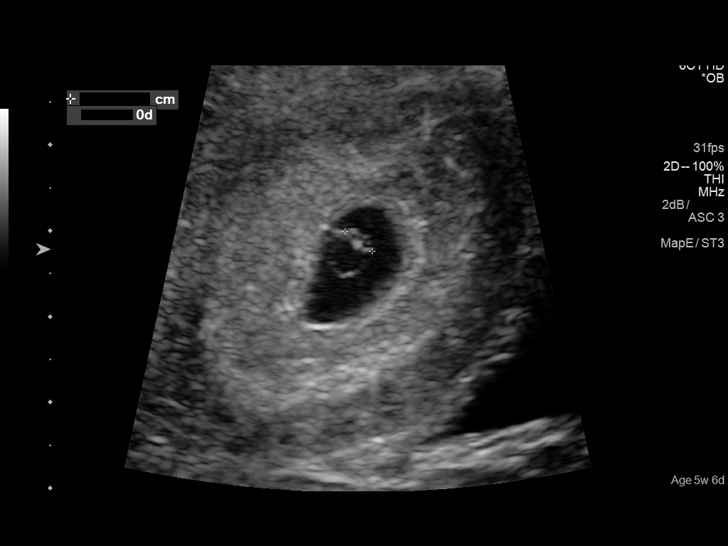
[im 41/156]
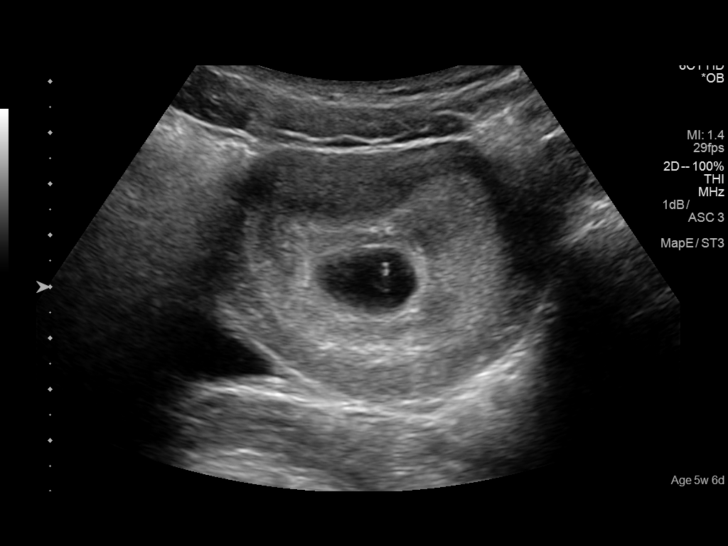
[im 52/156]
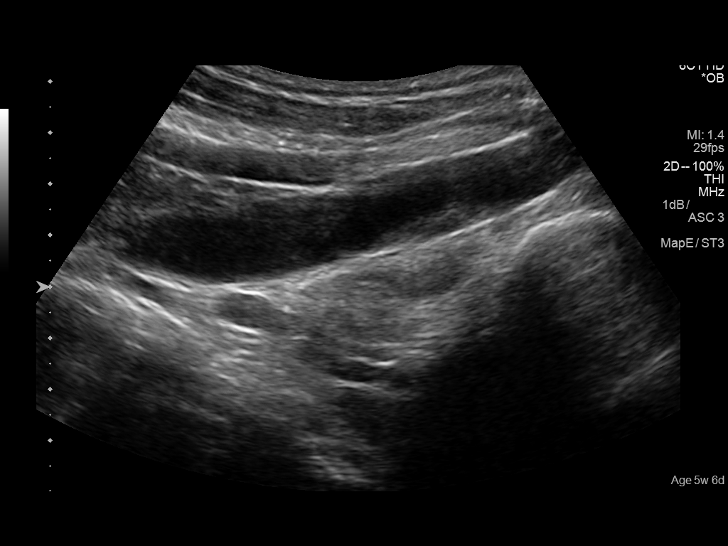
[im 64/156]
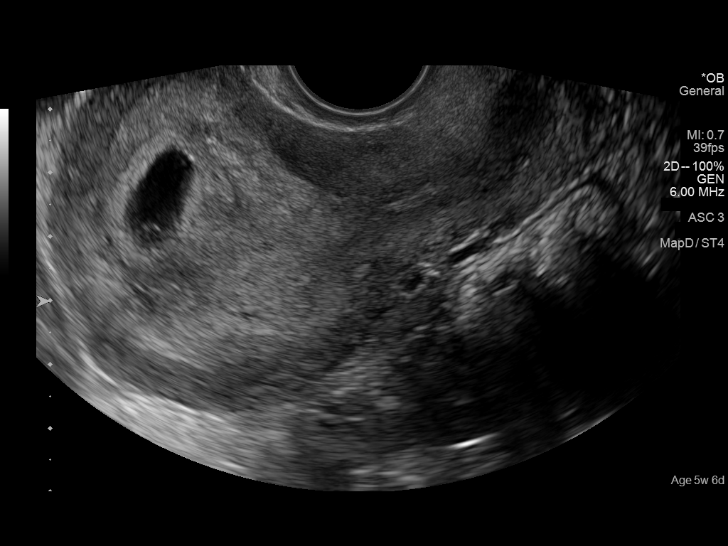
[im 75/156]
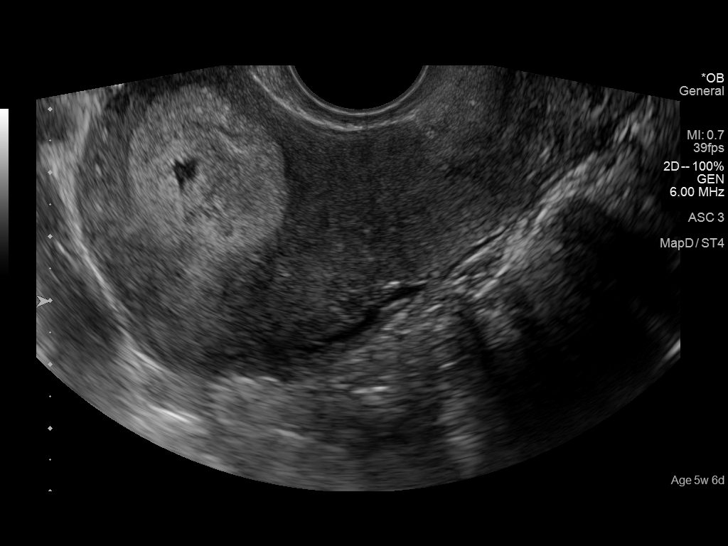
[im 87/156]
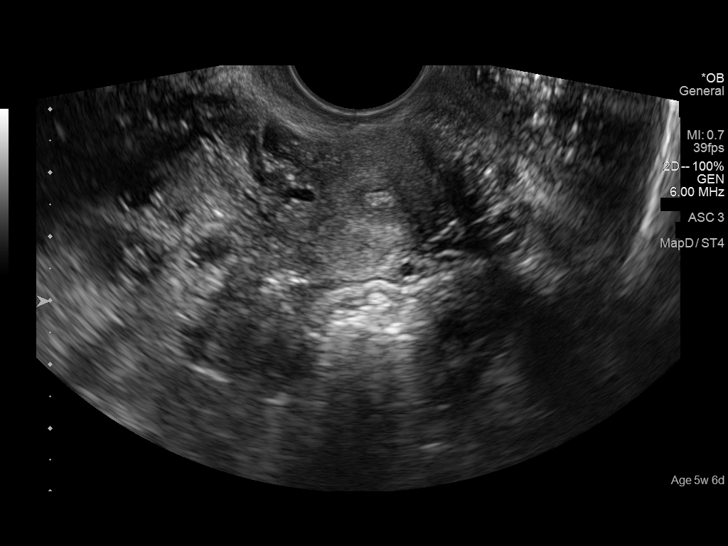
[im 98/156]
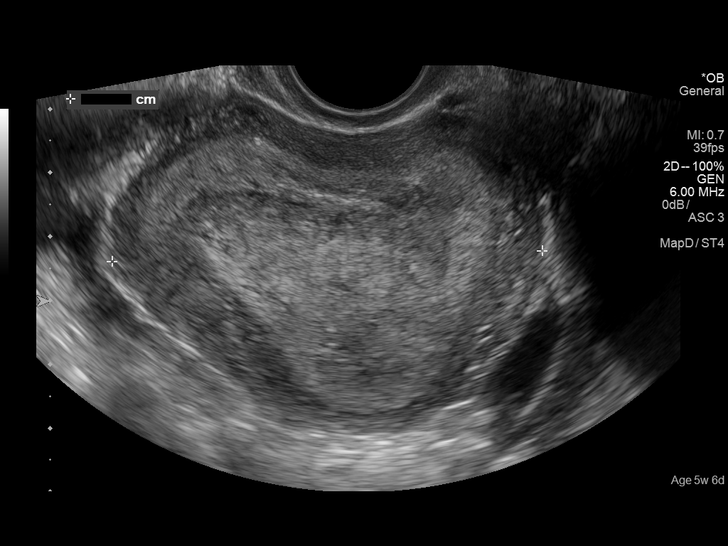
[im 110/156]
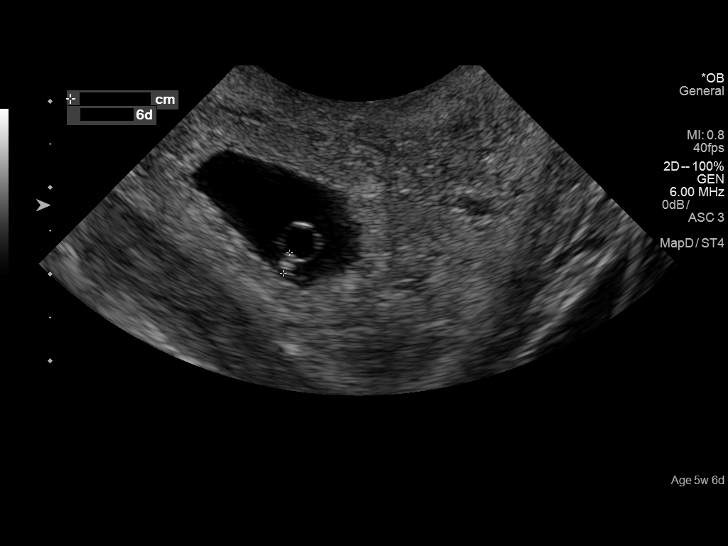
[im 121/156]
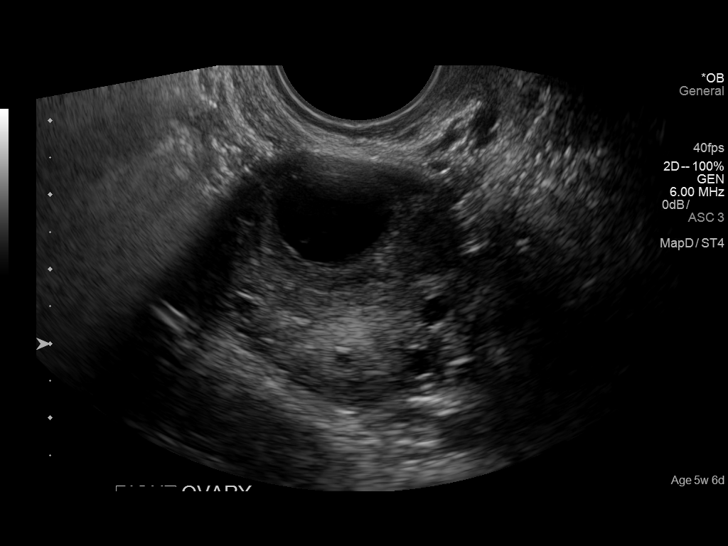
[im 133/156]
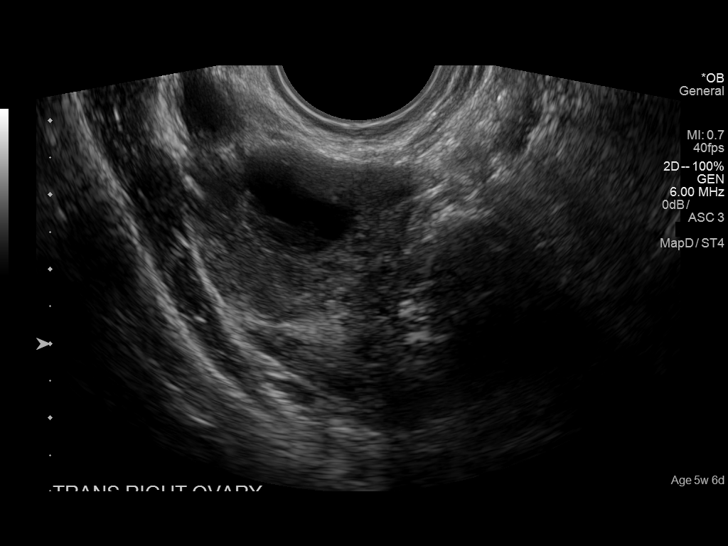
[im 144/156]
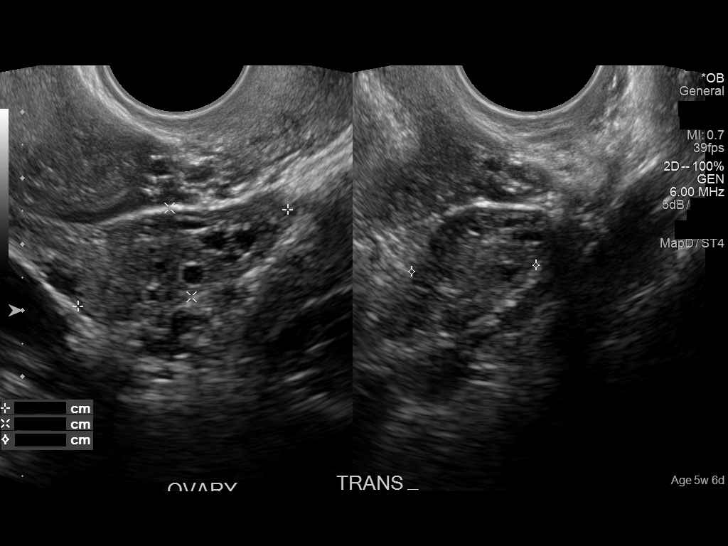
[im 156/156]
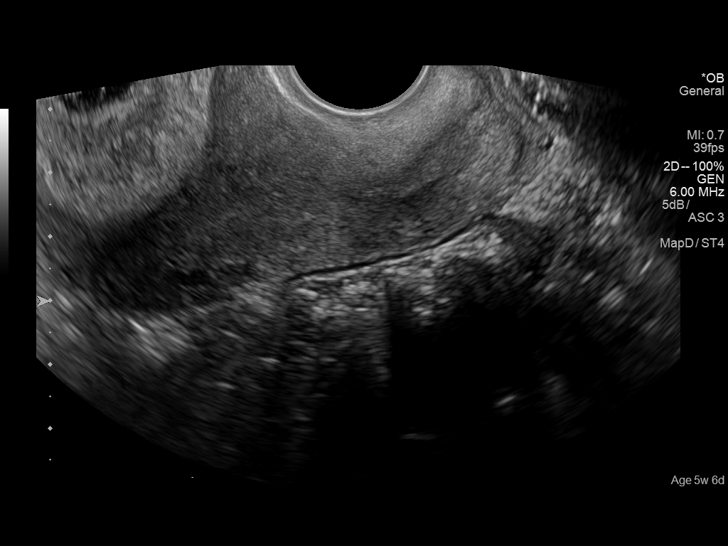

[14 of 28 positions shown; findings below may reference images not displayed]

FINDINGS: Intrauterine gestational sac: Visualized/normal in shape.

Yolk sac:  Present

Embryo:  Present

Cardiac Activity: Present

Heart Rate: 137  bpm

CRL:  4  mm   6 w   0 d                  US EDC: 03/29/2016

Subchorionic hemorrhage:  None visualized.

Maternal uterus/adnexae: Right ovary contains a 16 mm corpus luteum.
Left ovary is normal. There is no free fluid.
IMPRESSION: Live intrauterine gestation

## 2019-02-24 ENCOUNTER — Encounter (HOSPITAL_COMMUNITY): Payer: Self-pay | Admitting: Emergency Medicine

## 2019-02-24 ENCOUNTER — Other Ambulatory Visit: Payer: Self-pay

## 2019-02-24 ENCOUNTER — Emergency Department (HOSPITAL_COMMUNITY)
Admission: EM | Admit: 2019-02-24 | Discharge: 2019-02-24 | Disposition: A | Discharge status: Home / Self Care | Payer: Self-pay | Attending: Emergency Medicine | Admitting: Emergency Medicine

## 2019-02-24 DIAGNOSIS — Z3490 Encounter for supervision of normal pregnancy, unspecified, unspecified trimester: Secondary | ICD-10-CM

## 2019-02-24 DIAGNOSIS — O99519 Diseases of the respiratory system complicating pregnancy, unspecified trimester: Secondary | ICD-10-CM | POA: Insufficient documentation

## 2019-02-24 DIAGNOSIS — R509 Fever, unspecified: Secondary | ICD-10-CM | POA: Insufficient documentation

## 2019-02-24 DIAGNOSIS — Z3A00 Weeks of gestation of pregnancy not specified: Secondary | ICD-10-CM | POA: Insufficient documentation

## 2019-02-24 DIAGNOSIS — Z20828 Contact with and (suspected) exposure to other viral communicable diseases: Secondary | ICD-10-CM | POA: Insufficient documentation

## 2019-02-24 DIAGNOSIS — B349 Viral infection, unspecified: Secondary | ICD-10-CM | POA: Insufficient documentation

## 2019-02-24 LAB — CBC
HCT: 38.7 % (ref 36.0–46.0)
Hemoglobin: 12.7 g/dL (ref 12.0–15.0)
MCH: 29.2 pg (ref 26.0–34.0)
MCHC: 32.8 g/dL (ref 30.0–36.0)
MCV: 89 fL (ref 80.0–100.0)
Platelets: 290 10*3/uL (ref 150–400)
RBC: 4.35 MIL/uL (ref 3.87–5.11)
RDW: 13.2 % (ref 11.5–15.5)
WBC: 8.2 10*3/uL (ref 4.0–10.5)
nRBC: 0 % (ref 0.0–0.2)

## 2019-02-24 LAB — COMPREHENSIVE METABOLIC PANEL
ALT: 16 U/L (ref 0–44)
AST: 24 U/L (ref 15–41)
Albumin: 4.6 g/dL (ref 3.5–5.0)
Alkaline Phosphatase: 43 U/L (ref 38–126)
Anion gap: 8 (ref 5–15)
BUN: 10 mg/dL (ref 6–20)
CO2: 22 mmol/L (ref 22–32)
Calcium: 9.2 mg/dL (ref 8.9–10.3)
Chloride: 106 mmol/L (ref 98–111)
Creatinine, Ser: 0.92 mg/dL (ref 0.44–1.00)
GFR calc Af Amer: >60 mL/min (ref >60)
GFR calc non Af Amer: >60 mL/min (ref >60)
Glucose, Bld: 98 mg/dL (ref 70–99)
Potassium: 3.7 mmol/L (ref 3.5–5.1)
Sodium: 136 mmol/L (ref 135–145)
Total Bilirubin: 1.3 mg/dL — ABNORMAL HIGH (ref 0.3–1.2)
Total Protein: 8 g/dL (ref 6.5–8.1)

## 2019-02-24 LAB — URINALYSIS, ROUTINE W REFLEX MICROSCOPIC
Bilirubin Urine: NEGATIVE
Glucose, UA: NEGATIVE mg/dL
Hgb urine dipstick: NEGATIVE
Ketones, ur: 80 mg/dL — AB
Nitrite: NEGATIVE
Protein, ur: 30 mg/dL — AB
Specific Gravity, Urine: 1.026 (ref 1.005–1.030)
pH: 6 (ref 5.0–8.0)

## 2019-02-24 LAB — LIPASE, BLOOD: Lipase: 20 U/L (ref 11–51)

## 2019-02-24 LAB — HCG, QUANTITATIVE, PREGNANCY: hCG, Beta Chain, Quant, S: 2278 m[IU]/mL — ABNORMAL HIGH (ref <5)

## 2019-02-24 MED ORDER — ALUM & MAG HYDROXIDE-SIMETH 200-200-20 MG/5ML PO SUSP
30.0000 mL | Freq: Once | ORAL | Status: AC
  Administered 2019-02-24: 30 mL via ORAL
  Filled 2019-02-24: qty 30

## 2019-02-24 NOTE — Discharge Instructions (Signed)
Your testing today was positive for pregnancy.  We gave you OBGYN information to follow up.  Please avoid drinking alcohol, smoking, or using any other illicit drugs until you make a decision about your pregnancy.  We also sent out a covid test.  It may take up to a week to result.  Please try to self-quarantine in the meantime at home.

## 2019-02-24 NOTE — ED Triage Notes (Signed)
Pt states she has had emesis and a slight cough x 3 days. Denies fever. Afebrile at this time. Denies known sick contacts. Alert and oriented.

## 2019-02-24 NOTE — ED Provider Notes (Addendum)
[facility]-EMERGENCY DEPT Provider Note   CSN: [id number] Arrival date & time: 02/24/19  1117     History   Chief Complaint Chief Complaint  Patient presents with  . Emesis  . Cough    HPI Stacey Guzman is a 23 y.o. female.     23 yo F with a chief complaints of fevers chills cough and abdominal pain.  Going on for the past 3 to 4 days.  Her daughter is sick with a runny nose and cough as well.  She states that her abdomen hurts worse than the right lower quadrant.  No nausea vomiting or diarrhea.  The history is provided by the patient.  Emesis Severity:  Moderate Duration:  4 days Timing:  Constant Associated symptoms: abdominal pain and cough   Associated symptoms: no arthralgias, no chills, no fever, no headaches and no myalgias   Cough Associated symptoms: no chest pain, no chills, no fever, no headaches, no myalgias, no rhinorrhea, no shortness of breath and no wheezing   Illness Severity:  Moderate Onset quality:  Gradual Duration:  4 days Timing:  Constant Progression:  Worsening Chronicity:  New Associated symptoms: abdominal pain, cough and vomiting   Associated symptoms: no chest pain, no congestion, no fever, no headaches, no myalgias, no nausea, no rhinorrhea, no shortness of breath and no wheezing     Past Medical History:  Diagnosis Date  . Medical history non-contributory     Patient Active Problem List   Diagnosis Date Noted  . Fever of unknown origin following delivery, postpartum 03/27/2016  . S/P C-section 03/26/2016    Past Surgical History:  Procedure Laterality Date  . ADENOIDECTOMY    . CESAREAN SECTION N/A 03/26/2016   Procedure: PRIMARY CESAREAN SECTION;  Surgeon: Jaymes Graff, MD;  Location: [facility];  Service: Obstetrics;  Laterality: N/A;  Provider requests RNFA -ap  . TONSILLECTOMY       OB History    Gravida  1   Para  1   Term  1   Preterm      AB      Living  1     SAB      TAB      Ectopic      Multiple  0   Live Births  1            Home Medications    Prior to Admission medications   Medication Sig Start Date End Date Taking? Authorizing Provider  flintstones complete (FLINTSTONES) 60 MG chewable tablet Chew 2 tablets by mouth at bedtime.    [provider]  ibuprofen (ADVIL,MOTRIN) 600 MG tablet Take 1 tablet (600 mg total) by mouth every 6 (six) hours. 03/29/16   Silverio Lay, MD  oxyCODONE 10 MG TABS Take 1 tablet (10 mg total) by mouth every 4 (four) hours as needed (pain scale > 7). 03/29/16   Silverio Lay, MD    Family History Family History  Problem Relation Age of Onset  . Hypertension Maternal Grandfather   . Hypertension Paternal Grandfather     Social History Social History   Tobacco Use  . Smoking status: Never Smoker  . Smokeless tobacco: Never Used  Substance Use Topics  . Alcohol use: Yes  . Drug use: Yes    Types: Marijuana     Allergies   Patient has no known allergies.   Review of Systems Review of Systems  Constitutional: Negative for chills and fever.  HENT: Negative  for congestion and rhinorrhea.   Eyes: Negative for redness and visual disturbance.  Respiratory: Positive for cough. Negative for shortness of breath and wheezing.   Cardiovascular: Negative for chest pain and palpitations.  Gastrointestinal: Positive for abdominal pain and vomiting. Negative for nausea.  Genitourinary: Negative for dysuria and urgency.  Musculoskeletal: Negative for arthralgias and myalgias.  Skin: Negative for pallor and wound.  Neurological: Negative for dizziness and headaches.     Physical Exam Updated Vital Signs BP 100/63   Pulse 90   Temp 98.5 F (36.9 C)   Resp 17   LMP 01/26/2019   SpO2 100%   Physical Exam Vitals signs and nursing note reviewed.  Constitutional:      General: She is not in acute distress.    Appearance: She is well-developed. She is not diaphoretic.  HENT:     Head:  Normocephalic and atraumatic.  Eyes:     Pupils: Pupils are equal, round, and reactive to light.  Neck:     Musculoskeletal: Normal range of motion and neck supple.  Cardiovascular:     Rate and Rhythm: Normal rate and regular rhythm.     Heart sounds: No murmur. No friction rub. No gallop.   Pulmonary:     Effort: Pulmonary effort is normal.     Breath sounds: No wheezing or rales.  Abdominal:     General: There is no distension.     Palpations: Abdomen is soft.     Tenderness: There is abdominal tenderness.     Comments: Tenderness palpation in the epigastrium in the suprapubic region.  No pain with deep palpation of McBurney's point.  No pain in the right upper quadrant.  Negative Murphy sign.  Musculoskeletal:        General: No tenderness.  Skin:    General: Skin is warm and dry.  Neurological:     Mental Status: She is alert and oriented to person, place, and time.  Psychiatric:        Behavior: Behavior normal.      ED Treatments / Results  Labs (all labs ordered are listed, but only abnormal results are displayed) Labs Reviewed  COMPREHENSIVE METABOLIC PANEL - Abnormal; Notable for the following components:      Result Value   Total Bilirubin 1.3 (*)    All other components within normal limits  URINALYSIS, ROUTINE W REFLEX MICROSCOPIC - Abnormal; Notable for the following components:   APPearance HAZY (*)    Ketones, ur 80 (*)    Protein, ur 30 (*)    Leukocytes,Ua TRACE (*)    Bacteria, UA RARE (*)    All other components within normal limits  HCG, QUANTITATIVE, PREGNANCY - Abnormal; Notable for the following components:   hCG, Beta Chain, Quant, S 2,278 (*)    All other components within normal limits  NOVEL CORONAVIRUS, NAA (HOSP ORDER, SEND-OUT TO REF LAB; TAT 18-24 HRS)  LIPASE, BLOOD  CBC    EKG None  Radiology No results found.  Procedures Procedures (including critical care time)  Medications Ordered in ED Medications  alum & mag  hydroxide-simeth (MAALOX/MYLANTA) 200-200-20 MG/5ML suspension 30 mL (30 mLs Oral Given 02/24/19 1424)     Initial Impression / Assessment and Plan / ED Course  I have reviewed the triage vital signs and the nursing notes.  Pertinent labs & imaging results that were available during my care of the patient were reviewed by me and considered in my medical decision making (see chart for  details).  Clinical Course as of Feb 25 704  Tue Feb 24, 2019  1503 Pt signed out to me by Dr.  Adela LankFloyd, briefly 23 yo presenting with viral syndrome, including epigastric and suprapubic pain.  Pending labs, UA - if unremarkable, can send home.  She has a sick child in house with viral syndrome.  Although lower suspicion of COVID, will send COVID tier 3 test    [MT]  1637 Patient informed of pregnancy, will give OBGYN f/u information.  Patient unsure whether she wishes to keep pregnancy at this time.   [MT]  1645 With no dysuria, no nitrites, I have lower suspicion for UTI.  Sample appears contaminated with Squamaeous, will not treat at this time.   [MT]    Clinical Course User Index [MT] Trifan, Kermit BaloMatthew J, MD       23 yo F with a chief complaints of what sounds like a viral illness.  Cough nausea abdominal pain.  With the abdominal pain will obtain lab work to evaluate for possible hepatitis.  Give the patient a dose of Maalox.  Pregnancy test.  Reassess.  Signed out to Dr. Adan Sisriftan, please see his note for further details of care in the ED>   The patients results and plan were reviewed and discussed.   Any x-rays performed were independently reviewed by myself.   Differential diagnosis were considered with the presenting HPI.  Medications  alum & mag hydroxide-simeth (MAALOX/MYLANTA) 200-200-20 MG/5ML suspension 30 mL (30 mLs Oral Given 02/24/19 1424)    Vitals:   02/24/19 1131 02/24/19 1418 02/24/19 1705  BP: 124/81 119/82 100/63  Pulse: 90 99 90  Resp: 16 18 17   Temp: 98.7 F (37.1 C) 98.5 F  (36.9 C)   TempSrc: Oral    SpO2: 99% 99% 100%    Final diagnoses:  Viral illness  Pregnancy, unspecified gestational age       Final Clinical Impressions(s) / ED Diagnoses   Final diagnoses:  Viral illness  Pregnancy, unspecified gestational age    ED Discharge Orders    None       Melene PlanFloyd, Chun Sellen, DO 02/24/19 1511    Melene PlanFloyd, Akeem Heppler, DO 02/25/19 [phone]

## 2019-02-25 LAB — NOVEL CORONAVIRUS, NAA (HOSP ORDER, SEND-OUT TO REF LAB; TAT 18-24 HRS): SARS-CoV-2, NAA: NOT DETECTED

## 2019-03-27 ENCOUNTER — Other Ambulatory Visit: Payer: Self-pay

## 2019-03-27 ENCOUNTER — Emergency Department (HOSPITAL_COMMUNITY)
Admission: EM | Admit: 2019-03-27 | Discharge: 2019-03-28 | Disposition: A | Discharge status: Home / Self Care | Payer: Self-pay | Attending: Emergency Medicine | Admitting: Emergency Medicine

## 2019-03-27 ENCOUNTER — Encounter: Payer: Self-pay | Admitting: Emergency Medicine

## 2019-03-27 DIAGNOSIS — S3991XA Unspecified injury of abdomen, initial encounter: Secondary | ICD-10-CM | POA: Insufficient documentation

## 2019-03-27 DIAGNOSIS — N760 Acute vaginitis: Secondary | ICD-10-CM | POA: Insufficient documentation

## 2019-03-27 DIAGNOSIS — Y999 Unspecified external cause status: Secondary | ICD-10-CM | POA: Insufficient documentation

## 2019-03-27 DIAGNOSIS — N939 Abnormal uterine and vaginal bleeding, unspecified: Secondary | ICD-10-CM

## 2019-03-27 DIAGNOSIS — Z3A01 Less than 8 weeks gestation of pregnancy: Secondary | ICD-10-CM | POA: Insufficient documentation

## 2019-03-27 DIAGNOSIS — Y939 Activity, unspecified: Secondary | ICD-10-CM | POA: Insufficient documentation

## 2019-03-27 DIAGNOSIS — T1490XA Injury, unspecified, initial encounter: Secondary | ICD-10-CM

## 2019-03-27 DIAGNOSIS — Z3490 Encounter for supervision of normal pregnancy, unspecified, unspecified trimester: Secondary | ICD-10-CM

## 2019-03-27 DIAGNOSIS — Y929 Unspecified place or not applicable: Secondary | ICD-10-CM | POA: Insufficient documentation

## 2019-03-27 DIAGNOSIS — O99891 Other specified diseases and conditions complicating pregnancy: Secondary | ICD-10-CM | POA: Insufficient documentation

## 2019-03-27 DIAGNOSIS — O209 Hemorrhage in early pregnancy, unspecified: Secondary | ICD-10-CM | POA: Insufficient documentation

## 2019-03-27 DIAGNOSIS — B9689 Other specified bacterial agents as the cause of diseases classified elsewhere: Secondary | ICD-10-CM

## 2019-03-27 DIAGNOSIS — R1084 Generalized abdominal pain: Secondary | ICD-10-CM | POA: Insufficient documentation
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
## 2019-03-27 NOTE — ED Notes (Signed)
Pt able to provide urine specimen, at bedside if needed. Pt asked to change into a gown and provided with warm blankets.

## 2019-03-28 ENCOUNTER — Emergency Department (HOSPITAL_COMMUNITY): Payer: Self-pay

## 2019-03-28 LAB — WET PREP, GENITAL
Sperm: NONE SEEN
Trich, Wet Prep: NONE SEEN
Yeast Wet Prep HPF POC: NONE SEEN

## 2019-03-28 LAB — CBC WITH DIFFERENTIAL/PLATELET
Abs Immature Granulocytes: 0.04 10*3/uL (ref 0.00–0.07)
Basophils Absolute: 0.1 10*3/uL (ref 0.0–0.1)
Basophils Relative: 1 %
Eosinophils Absolute: 0.1 10*3/uL (ref 0.0–0.5)
Eosinophils Relative: 1 %
HCT: 36 % (ref 36.0–46.0)
Hemoglobin: 12 g/dL (ref 12.0–15.0)
Immature Granulocytes: 1 %
Lymphocytes Relative: 39 %
Lymphs Abs: 3.1 10*3/uL (ref 0.7–4.0)
MCH: 29.5 pg (ref 26.0–34.0)
MCHC: 33.3 g/dL (ref 30.0–36.0)
MCV: 88.5 fL (ref 80.0–100.0)
Monocytes Absolute: 0.7 10*3/uL (ref 0.1–1.0)
Monocytes Relative: 9 %
Neutro Abs: 4.1 10*3/uL (ref 1.7–7.7)
Neutrophils Relative %: 49 %
Platelets: 278 10*3/uL (ref 150–400)
RBC: 4.07 MIL/uL (ref 3.87–5.11)
RDW: 12.6 % (ref 11.5–15.5)
WBC: 8.1 10*3/uL (ref 4.0–10.5)
nRBC: 0 % (ref 0.0–0.2)

## 2019-03-28 LAB — BASIC METABOLIC PANEL
Anion gap: 9 (ref 5–15)
BUN: 9 mg/dL (ref 6–20)
CO2: 22 mmol/L (ref 22–32)
Calcium: 9.1 mg/dL (ref 8.9–10.3)
Chloride: 105 mmol/L (ref 98–111)
Creatinine, Ser: 0.88 mg/dL (ref 0.44–1.00)
GFR calc Af Amer: >60 mL/min (ref >60)
GFR calc non Af Amer: >60 mL/min (ref >60)
Glucose, Bld: 90 mg/dL (ref 70–99)
Potassium: 3.9 mmol/L (ref 3.5–5.1)
Sodium: 136 mmol/L (ref 135–145)

## 2019-03-28 LAB — URINALYSIS, ROUTINE W REFLEX MICROSCOPIC
Bilirubin Urine: NEGATIVE
Glucose, UA: NEGATIVE mg/dL
Ketones, ur: NEGATIVE mg/dL
Nitrite: POSITIVE — AB
Protein, ur: NEGATIVE mg/dL
Specific Gravity, Urine: 1.014 (ref 1.005–1.030)
pH: 6 (ref 5.0–8.0)

## 2019-03-28 LAB — HCG, QUANTITATIVE, PREGNANCY: hCG, Beta Chain, Quant, S: 7182 m[IU]/mL — ABNORMAL HIGH (ref <5)

## 2019-03-28 MED ORDER — ACETAMINOPHEN 325 MG PO TABS
650.0000 mg | ORAL_TABLET | Freq: Once | ORAL | Status: AC
  Administered 2019-03-28: 650 mg via ORAL
  Filled 2019-03-28: qty 2

## 2019-03-28 MED ORDER — CEPHALEXIN 500 MG PO CAPS
500.0000 mg | ORAL_CAPSULE | Freq: Once | ORAL | Status: AC
  Administered 2019-03-28: 03:00:00 500 mg via ORAL
  Filled 2019-03-28: qty 1

## 2019-03-28 MED ORDER — METRONIDAZOLE 500 MG PO TABS: 500.0000 mg | ORAL_TABLET | Freq: Two times a day (BID) | ORAL | 0 refills | Status: DC

## 2019-03-28 MED ORDER — CEPHALEXIN 500 MG PO CAPS: 500.0000 mg | ORAL_CAPSULE | Freq: Three times a day (TID) | ORAL | 0 refills | Status: DC

## 2019-03-28 NOTE — ED Notes (Signed)
Ultrasound at bedside

## 2019-03-28 NOTE — ED Notes (Signed)
PT stated she has to leave to pick up child.  Requested Pt stay and wait for discharge. Attempting to make MD aware.

## 2019-03-28 NOTE — ED Notes (Signed)
Assisted EDP with pelvic exam. Pt tolerated well.  

## 2019-03-28 NOTE — ED Notes (Signed)
Urine culture sent to the lab. 

## 2019-03-28 NOTE — Discharge Instructions (Addendum)
Your urine looks like you have a urinary tract infection.  Take the antibiotics until gone.  Please call maternity admissions to get a follow-up appointment in the next 2 to 3 days.  Your ultrasound shows your only about 6 weeks and 2 days pregnant with estimated delivery date of October 27, 2019.  But based on your last normal.  You should have been 9 weeks and 4 days pregnant.  You also have a infection called bacterial vaginosis, take the metronidazole until gone.

## 2019-03-28 NOTE — ED Notes (Signed)
Pelvic set up at bedside and pt ready for exam.
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
## 2019-03-31 LAB — GC/CHLAMYDIA PROBE AMP (~~LOC~~) NOT AT ARMC
Chlamydia: NEGATIVE
Neisseria Gonorrhea: NEGATIVE

## 2019-12-07 ENCOUNTER — Emergency Department (HOSPITAL_COMMUNITY)
Admission: EM | Admit: 2019-12-07 | Discharge: 2019-12-07 | Disposition: A | Discharge status: Home / Self Care | Payer: Medicaid Other | Attending: Emergency Medicine | Admitting: Emergency Medicine

## 2019-12-07 ENCOUNTER — Encounter (HOSPITAL_COMMUNITY): Payer: Self-pay

## 2019-12-07 ENCOUNTER — Other Ambulatory Visit: Payer: Self-pay

## 2019-12-07 DIAGNOSIS — R102 Pelvic and perineal pain: Secondary | ICD-10-CM

## 2019-12-07 DIAGNOSIS — R112 Nausea with vomiting, unspecified: Secondary | ICD-10-CM | POA: Insufficient documentation

## 2019-12-07 DIAGNOSIS — R1030 Lower abdominal pain, unspecified: Secondary | ICD-10-CM | POA: Insufficient documentation

## 2019-12-07 HISTORY — DX: Schizophrenia, unspecified: F20.9

## 2019-12-07 LAB — URINALYSIS, ROUTINE W REFLEX MICROSCOPIC
Bilirubin Urine: NEGATIVE
Glucose, UA: NEGATIVE mg/dL
Hgb urine dipstick: NEGATIVE
Ketones, ur: NEGATIVE mg/dL
Leukocytes,Ua: NEGATIVE
Nitrite: NEGATIVE
Protein, ur: NEGATIVE mg/dL
Specific Gravity, Urine: 1.021 (ref 1.005–1.030)
pH: 6 (ref 5.0–8.0)

## 2019-12-07 LAB — COMPREHENSIVE METABOLIC PANEL
ALT: 14 U/L (ref 0–44)
AST: 19 U/L (ref 15–41)
Albumin: 4.1 g/dL (ref 3.5–5.0)
Alkaline Phosphatase: 40 U/L (ref 38–126)
Anion gap: 6 (ref 5–15)
BUN: 12 mg/dL (ref 6–20)
CO2: 28 mmol/L (ref 22–32)
Calcium: 8.6 mg/dL — ABNORMAL LOW (ref 8.9–10.3)
Chloride: 105 mmol/L (ref 98–111)
Creatinine, Ser: 0.83 mg/dL (ref 0.44–1.00)
GFR calc Af Amer: >60 mL/min (ref >60)
GFR calc non Af Amer: >60 mL/min (ref >60)
Glucose, Bld: 93 mg/dL (ref 70–99)
Potassium: 3.9 mmol/L (ref 3.5–5.1)
Sodium: 139 mmol/L (ref 135–145)
Total Bilirubin: 1 mg/dL (ref 0.3–1.2)
Total Protein: 7.6 g/dL (ref 6.5–8.1)

## 2019-12-07 LAB — LIPASE, BLOOD: Lipase: 23 U/L (ref 11–51)

## 2019-12-07 LAB — CBC
HCT: 37.3 % (ref 36.0–46.0)
Hemoglobin: 12 g/dL (ref 12.0–15.0)
MCH: 28.9 pg (ref 26.0–34.0)
MCHC: 32.2 g/dL (ref 30.0–36.0)
MCV: 89.9 fL (ref 80.0–100.0)
Platelets: 258 10*3/uL (ref 150–400)
RBC: 4.15 MIL/uL (ref 3.87–5.11)
RDW: 13.7 % (ref 11.5–15.5)
WBC: 6.7 10*3/uL (ref 4.0–10.5)
nRBC: 0 % (ref 0.0–0.2)

## 2019-12-07 LAB — WET PREP, GENITAL
Clue Cells Wet Prep HPF POC: NONE SEEN
Sperm: NONE SEEN
Trich, Wet Prep: NONE SEEN
Yeast Wet Prep HPF POC: NONE SEEN

## 2019-12-07 LAB — I-STAT BETA HCG BLOOD, ED (MC, WL, AP ONLY): I-stat hCG, quantitative: <5 m[IU]/mL (ref <5)

## 2019-12-07 MED ORDER — SODIUM CHLORIDE 0.9% FLUSH: 3.0000 mL | Freq: Once | INTRAVENOUS | Status: DC

## 2019-12-07 NOTE — ED Provider Notes (Signed)
[facility]-EMERGENCY DEPT Provider Note   CSN: [id number] Arrival date & time: 12/07/19  6301     History Chief Complaint  Patient presents with  . Abdominal Pain  . Emesis    Stacey Guzman is a 24 y.o. female no relevant past medical history presents to the ED with a 3-week history of lower abdominal fullness and discomfort in addition to intermittent episodes of nausea and emesis.  She reports that she has had 3 episodes of nonbloody emesis over the course of the past 3 weeks.  She states that her symptoms began around the time of her menses.  She believes that she has [place] healthcare, but has not received her insurance card.  She moved here from [place] and has not yet established with a primary care provider.  She suspects that some of her symptoms may be attributed to her Abilify medication that was prescribed by [facility] for schizophrenia.  She has not seen them in a while and feels as though her schizophrenia has been well controlled.  She is sexually active with one partner and does not use protection.  She does endorse a history of bacterial vaginosis and is unsure as to whether or not this feels similar.  She does endorse increased vaginal discharge, but denies any fevers or chills, significant back pain, pain with defecation, changes in bowel habits, dysuria, increased urinary frequency, dyspareunia, vaginal odor, or other symptoms.  She has low suspicion for STIs and is declining testing at this time.  Patient thinks that she might be pregnant.  HPI     Past Medical History:  Diagnosis Date  . Medical history non-contributory   . Schizophrenia [facility])     Patient Active Problem List   Diagnosis Date Noted  . Fever of unknown origin following delivery, postpartum 03/27/2016  . S/P C-section 03/26/2016    Past Surgical History:  Procedure Laterality Date  . ADENOIDECTOMY    . CESAREAN SECTION N/A 03/26/2016   Procedure: PRIMARY CESAREAN SECTION;  Surgeon:  Jaymes Graff, MD;  Location: [facility];  Service: Obstetrics;  Laterality: N/A;  Provider requests RNFA -ap  . TONSILLECTOMY       OB History    Gravida  2   Para  1   Term  1   Preterm      AB      Living  1     SAB      TAB      Ectopic      Multiple  0   Live Births  1           Family History  Problem Relation Age of Onset  . Hypertension Maternal Grandfather   . Hypertension Paternal Grandfather   . Diabetes Mother   . Hypertension Mother   . Asthma Father   . Hypertension Father     Social History   Tobacco Use  . Smoking status: Never Smoker  . Smokeless tobacco: Never Used  Vaping Use  . Vaping Use: Never used  Substance Use Topics  . Alcohol use: Yes  . Drug use: Yes    Types: Marijuana    Home Medications Prior to Admission medications   Medication Sig Start Date End Date Taking? Authorizing Provider  ARIPiprazole (ABILIFY) 15 MG tablet Take 15 mg by mouth daily.   Yes [provider]  cephALEXin (KEFLEX) 500 MG capsule Take 1 capsule (500 mg total) by mouth 3 (three) times daily. Patient not taking: Reported  on 12/07/2019 03/28/19   Devoria Albe, MD  ibuprofen (ADVIL,MOTRIN) 600 MG tablet Take 1 tablet (600 mg total) by mouth every 6 (six) hours. Patient not taking: Reported on 12/07/2019 03/29/16   Silverio Lay, MD  metroNIDAZOLE (FLAGYL) 500 MG tablet Take 1 tablet (500 mg total) by mouth 2 (two) times daily. Patient not taking: Reported on 12/07/2019 03/28/19   Devoria Albe, MD  oxyCODONE 10 MG TABS Take 1 tablet (10 mg total) by mouth every 4 (four) hours as needed (pain scale > 7). Patient not taking: Reported on 12/07/2019 03/29/16   Silverio Lay, MD    Allergies    Patient has no known allergies.  Review of Systems   Review of Systems  All other systems reviewed and are negative.   Physical Exam Updated Vital Signs BP 113/71   Pulse 91   Temp 98 F (36.7 C) (Oral)   Resp 18   Ht 5' 3.5" (1.613 m)   Wt  74.4 kg   LMP 01/26/2019   SpO2 99%   BMI 28.60 kg/m   Physical Exam Vitals and nursing note reviewed. Exam conducted with a chaperone present.  Constitutional:      General: She is not in acute distress.    Appearance: Normal appearance. She is not ill-appearing.  HENT:     Head: Normocephalic and atraumatic.  Eyes:     General: No scleral icterus.    Conjunctiva/sclera: Conjunctivae normal.  Cardiovascular:     Rate and Rhythm: Normal rate and regular rhythm.     Pulses: Normal pulses.     Heart sounds: Normal heart sounds.  Pulmonary:     Effort: Pulmonary effort is normal. No respiratory distress.     Breath sounds: Normal breath sounds.  Abdominal:     Comments: Soft, nondistended.  TTP in suprapubic region, no TTP elsewhere.  No guarding.  No overlying skin changes.  Negative CVAT bilaterally.  Genitourinary:    Comments: Speculum exam: Normal external vagina.  Thin white discharge in vaginal vault. Bimanual exam: Mild left-sided adnexal TTP, no right-sided adnexal TTP.  No CMT. Musculoskeletal:     Cervical back: Normal range of motion. No rigidity.     Right lower leg: No edema.     Left lower leg: No edema.  Skin:    General: Skin is dry.     Capillary Refill: Capillary refill takes less than 2 seconds.  Neurological:     Mental Status: She is alert and oriented to person, place, and time.     GCS: GCS eye subscore is 4. GCS verbal subscore is 5. GCS motor subscore is 6.  Psychiatric:        Mood and Affect: Mood normal.        Behavior: Behavior normal.        Thought Content: Thought content normal.     ED Results / Procedures / Treatments   Labs (all labs ordered are listed, but only abnormal results are displayed) Labs Reviewed  WET PREP, GENITAL - Abnormal; Notable for the following components:      Result Value   WBC, Wet Prep HPF POC FEW (*)    All other components within normal limits  COMPREHENSIVE METABOLIC PANEL - Abnormal; Notable for the  following components:   Calcium 8.6 (*)    All other components within normal limits  LIPASE, BLOOD  CBC  URINALYSIS, ROUTINE W REFLEX MICROSCOPIC  I-STAT BETA HCG BLOOD, ED ([facility], [facility], [facility] ONLY)  EKG None  Radiology No results found.  Procedures Procedures (including critical care time)  Medications Ordered in ED Medications - No data to display  ED Course  I have reviewed the triage vital signs and the nursing notes.  Pertinent labs & imaging results that were available during my care of the patient were reviewed by me and considered in my medical decision making (see chart for details).    MDM Rules/Calculators/A&P                          Patient's history and physical exam is largely benign.  Patient has TTP appreciated in suprapubic region, but none elsewhere.  Concern for cystitis versus possible GU infection.  No nausea on my exam and she denies any fevers or chills.  Labs CBC: No leukocytosis concerning for infection.  WNL. CMP: Unremarkable. Lipase: WNL. Beta-hCG: Negative for pregnancy. UA: No evidence to suggest infection.  We will provide pelvic exam and obtain wet prep.  Do not feel as though imaging is warranted.    During pelvic exam, patient had mild left-sided adnexal tenderness, but no CMT or other significant findings.  Given that her symptoms are only elicited with palpation (reproducible) and not while at rest, I have lower suspicion for an ovarian torsion.  I also have lower suspicion given duration of her symptoms and lack of distress on exam.  Patient is negative for pregnancy so I do not feel as though emergent pelvic ultrasound is warranted.  We will continue to await for wet prep before determining management.    Wet prep was negative for any clue cells concerning for BV or trichomoniasis.  Only few WBC seen on wet prep.  Appear to be mild, physiologic discharge on my examination.  Do not feel as though any intervention is warranted.  I do feel  that given her 3-week duration of suprapubic abdominal discomfort, she may ultimately benefit from GYN evaluation and outpatient pelvic ultrasound to assess for uterine fibroids or other intrapelvic pathology, but nothing emergent.  Recommending naproxen or Tylenol as needed for symptoms of discomfort.  All of the evaluation and work-up results were discussed with the patient and any family at bedside.  Patient and/or family were informed that while patient is appropriate for discharge at this time, some medical emergencies may only develop or become detectable after a period of time.  I specifically instructed patient and/or family to return to return to the ED or seek immediate medical attention for any new or worsening symptoms.  They were provided opportunity to ask any additional questions and have none at this time.  Prior to discharge patient is feeling well, agreeable with plan for discharge home.  They have expressed understanding of verbal discharge instructions as well as return precautions and are agreeable to the plan.    Final Clinical Impression(s) / ED Diagnoses Final diagnoses:  Suprapubic discomfort    Rx / DC Orders ED Discharge Orders    None       Lorelee New, PA-C 12/07/19 1436    Wynetta Fines, MD 12/07/19 1443

## 2019-12-07 NOTE — Discharge Instructions (Addendum)
Your work-up today was very reassuring.  Please take ibuprofen and/or Tylenol as needed for symptoms of discomfort.  Please call [employer] to ensure that your insurance is intact.  You will need to follow-up with a primary care provider here locally in [place], [place] area.  I recommend a follow-up with your psychiatrist at [facility] for ongoing medication management.  If your symptoms of pelvic/suprapubic discomfort continue to persist, you may benefit from OB/GYN evaluation.  Please call [facility] to schedule an appointment.  You may ultimately benefit from pelvic US.    Return to the ED or seek immediate medical attention should you experience any new or worsening symptoms.

## 2019-12-07 NOTE — ED Triage Notes (Signed)
Patient c/o mid abdominal pain and emesis x 3 weeks. Patient states she had a negative home pregnancy test.

## 2020-03-23 ENCOUNTER — Other Ambulatory Visit: Payer: Self-pay

## 2020-03-23 ENCOUNTER — Emergency Department (HOSPITAL_COMMUNITY)
Admission: EM | Admit: 2020-03-23 | Discharge: 2020-03-23 | Disposition: A | Discharge status: Home / Self Care | Payer: Medicaid Other | Attending: Emergency Medicine | Admitting: Emergency Medicine

## 2020-03-23 ENCOUNTER — Encounter (HOSPITAL_COMMUNITY): Payer: Self-pay

## 2020-03-23 DIAGNOSIS — K5900 Constipation, unspecified: Secondary | ICD-10-CM | POA: Insufficient documentation

## 2020-03-23 DIAGNOSIS — R1033 Periumbilical pain: Secondary | ICD-10-CM

## 2020-03-23 LAB — URINALYSIS, ROUTINE W REFLEX MICROSCOPIC
Bilirubin Urine: NEGATIVE
Glucose, UA: NEGATIVE mg/dL
Ketones, ur: NEGATIVE mg/dL
Nitrite: NEGATIVE
Protein, ur: NEGATIVE mg/dL
RBC / HPF: >50 RBC/hpf — ABNORMAL HIGH (ref 0–5)
Specific Gravity, Urine: 1.021 (ref 1.005–1.030)
pH: 6 (ref 5.0–8.0)

## 2020-03-23 LAB — CBC
HCT: 37.3 % (ref 36.0–46.0)
Hemoglobin: 11.8 g/dL — ABNORMAL LOW (ref 12.0–15.0)
MCH: 28.2 pg (ref 26.0–34.0)
MCHC: 31.6 g/dL (ref 30.0–36.0)
MCV: 89.2 fL (ref 80.0–100.0)
Platelets: 249 10*3/uL (ref 150–400)
RBC: 4.18 MIL/uL (ref 3.87–5.11)
RDW: 13.2 % (ref 11.5–15.5)
WBC: 7.4 10*3/uL (ref 4.0–10.5)
nRBC: 0 % (ref 0.0–0.2)

## 2020-03-23 LAB — COMPREHENSIVE METABOLIC PANEL
ALT: 12 U/L (ref 0–44)
AST: 18 U/L (ref 15–41)
Albumin: 3.4 g/dL — ABNORMAL LOW (ref 3.5–5.0)
Alkaline Phosphatase: 48 U/L (ref 38–126)
Anion gap: 6 (ref 5–15)
BUN: 9 mg/dL (ref 6–20)
CO2: 28 mmol/L (ref 22–32)
Calcium: 8.8 mg/dL — ABNORMAL LOW (ref 8.9–10.3)
Chloride: 103 mmol/L (ref 98–111)
Creatinine, Ser: 0.86 mg/dL (ref 0.44–1.00)
GFR, Estimated: >60 mL/min (ref >60)
Glucose, Bld: 99 mg/dL (ref 70–99)
Potassium: 4.1 mmol/L (ref 3.5–5.1)
Sodium: 137 mmol/L (ref 135–145)
Total Bilirubin: 1.1 mg/dL (ref 0.3–1.2)
Total Protein: 6.6 g/dL (ref 6.5–8.1)

## 2020-03-23 LAB — LIPASE, BLOOD: Lipase: 27 U/L (ref 11–51)

## 2020-03-23 LAB — I-STAT BETA HCG BLOOD, ED (MC, WL, AP ONLY): I-stat hCG, quantitative: <5 m[IU]/mL (ref <5)

## 2020-03-23 MED ORDER — DOCUSATE SODIUM 100 MG PO CAPS: 100.0000 mg | ORAL_CAPSULE | Freq: Two times a day (BID) | ORAL | 0 refills | Status: DC

## 2020-03-23 MED ORDER — POLYETHYLENE GLYCOL 3350 17 GM/SCOOP PO POWD: 17.0000 g | Freq: Every day | ORAL | 0 refills | Status: DC

## 2020-03-23 NOTE — Discharge Instructions (Signed)
Please read and follow all provided instructions.  Your diagnoses today include:  1. Periumbilical pain   2. Constipation, unspecified constipation type     Tests performed today include:  Blood counts and electrolytes  Blood tests to check liver and kidney function  Blood tests to check pancreas function  Urine test to look for infection - some blood related to period, no definite infection  Pregnancy test - was negative  Vital signs. See below for your results today.   Medications prescribed:   Colace - stool softener  This medication can be found over-the-counter.   Continue colace for 2 weeks after your stools return to normal to prevent constipation.    Miralax - laxative  This medication can be found over-the-counter.   Take any prescribed medications only as directed.  Home care instructions:   Follow any educational materials contained in this packet.  Follow-up instructions: Please follow-up with your primary care provider in the next 5 days for further evaluation of your symptoms.    Return instructions:  SEEK IMMEDIATE MEDICAL ATTENTION IF:  The pain does not go away or becomes severe   A temperature above 101F develops   Repeated vomiting occurs (multiple episodes)   The pain becomes localized to portions of the abdomen. The right side could possibly be appendicitis. In an adult, the left lower portion of the abdomen could be colitis or diverticulitis.   Blood is being passed in stools or vomit (bright red or black tarry stools)   You develop chest pain, difficulty breathing, dizziness or fainting, or become confused, poorly responsive, or inconsolable (young children)  If you have any other emergent concerns regarding your health  Additional Information: Abdominal (belly) pain can be caused by many things. Your caregiver performed an examination and possibly ordered blood/urine tests and imaging (CT scan, x-rays, ultrasound). Many cases can be  observed and treated at home after initial evaluation in the emergency department. Even though you are being discharged home, abdominal pain can be unpredictable. Therefore, you need a repeated exam if your pain does not resolve, returns, or worsens. Most patients with abdominal pain don't have to be admitted to the hospital or have surgery, but serious problems like appendicitis and gallbladder attacks can start out as nonspecific pain. Many abdominal conditions cannot be diagnosed in one visit, so follow-up evaluations are very important.  Your vital signs today were: BP 119/79    Pulse 96    Temp 98.4 F (36.9 C) (Oral)    Resp 14    Ht 5\' 3"  (1.6 m)    Wt 74.8 kg    SpO2 97%    BMI 29.23 kg/m  If your blood pressure (bp) was elevated above 135/85 this visit, please have this repeated by your doctor within one month. --------------

## 2020-03-23 NOTE — ED Triage Notes (Signed)
Pt presents with pain around her "beely button" x2 months, stinging pain when she touches that area

## 2020-03-23 NOTE — ED Provider Notes (Signed)
[facility] EMERGENCY DEPARTMENT Provider Note   CSN: [id number] Arrival date & time: 03/23/20  1108     History Chief Complaint  Patient presents with  . Abdominal Pain    Stacey Guzman is a 24 y.o. female.  Patient with history of cesarean section, no other abdominal surgeries presents the emergency department today for evaluation of abdominal pain.  Patient reports that over the past 2 months she has had intermittent, sharp pain around her umbilicus with associated constipation.  No drainage or swelling around the bellybutton.  She has had some mild abdominal distention.  No associated nausea, vomiting, or diarrhea.  No hematuria or irritative UTI symptoms including dysuria, increased frequency or urgency.  Patient states that her menstrual period was 9 days late and she started yesterday.  Currently has heavier bleeding than normal.  No treatments prior to arrival.        Past Medical History:  Diagnosis Date  . Medical history non-contributory   . Schizophrenia [facility])     Patient Active Problem List   Diagnosis Date Noted  . Fever of unknown origin following delivery, postpartum 03/27/2016  . S/P C-section 03/26/2016    Past Surgical History:  Procedure Laterality Date  . ADENOIDECTOMY    . CESAREAN SECTION N/A 03/26/2016   Procedure: PRIMARY CESAREAN SECTION;  Surgeon: Jaymes Graff, MD;  Location: [facility];  Service: Obstetrics;  Laterality: N/A;  Provider requests RNFA -ap  . TONSILLECTOMY       OB History    Gravida  2   Para  1   Term  1   Preterm      AB      Living  1     SAB      TAB      Ectopic      Multiple  0   Live Births  1           Family History  Problem Relation Age of Onset  . Hypertension Maternal Grandfather   . Hypertension Paternal Grandfather   . Diabetes Mother   . Hypertension Mother   . Asthma Father   . Hypertension Father     Social History   Tobacco Use  . Smoking  status: Never Smoker  . Smokeless tobacco: Never Used  Vaping Use  . Vaping Use: Never used  Substance Use Topics  . Alcohol use: Yes  . Drug use: Yes    Types: Marijuana    Home Medications Prior to Admission medications   Medication Sig Start Date End Date Taking? Authorizing Provider  ARIPiprazole (ABILIFY) 15 MG tablet Take 15 mg by mouth daily.    [provider]  cephALEXin (KEFLEX) 500 MG capsule Take 1 capsule (500 mg total) by mouth 3 (three) times daily. Patient not taking: Reported on 12/07/2019 03/28/19   Devoria Albe, MD  ibuprofen (ADVIL,MOTRIN) 600 MG tablet Take 1 tablet (600 mg total) by mouth every 6 (six) hours. Patient not taking: Reported on 12/07/2019 03/29/16   Silverio Lay, MD  metroNIDAZOLE (FLAGYL) 500 MG tablet Take 1 tablet (500 mg total) by mouth 2 (two) times daily. Patient not taking: Reported on 12/07/2019 03/28/19   Devoria Albe, MD  oxyCODONE 10 MG TABS Take 1 tablet (10 mg total) by mouth every 4 (four) hours as needed (pain scale > 7). Patient not taking: Reported on 12/07/2019 03/29/16   Silverio Lay, MD    Allergies    Patient has no known allergies.  Review of Systems   Review of Systems  Constitutional: Negative for fever.  HENT: Negative for rhinorrhea and sore throat.   Eyes: Negative for redness.  Respiratory: Negative for cough.   Cardiovascular: Negative for chest pain.  Gastrointestinal: Positive for abdominal pain and constipation. Negative for diarrhea, nausea and vomiting.  Genitourinary: Positive for vaginal bleeding. Negative for dysuria, frequency, hematuria, urgency and vaginal discharge.  Musculoskeletal: Negative for myalgias.  Skin: Negative for rash.  Neurological: Negative for headaches.    Physical Exam Updated Vital Signs BP 116/72 (BP Location: Right Arm)   Pulse (!) 105   Temp 98.4 F (36.9 C) (Oral)   Resp 14   Ht 5\' 3"  (1.6 m)   Wt 74.8 kg   SpO2 100%   BMI 29.23 kg/m   Physical Exam Vitals and  nursing note reviewed.  Constitutional:      General: She is not in acute distress.    Appearance: She is well-developed.  HENT:     Head: Normocephalic and atraumatic.     Right Ear: External ear normal.     Left Ear: External ear normal.     Nose: Nose normal.  Eyes:     Conjunctiva/sclera: Conjunctivae normal.  Cardiovascular:     Rate and Rhythm: Normal rate and regular rhythm.     Heart sounds: No murmur heard.   Pulmonary:     Effort: No respiratory distress.     Breath sounds: No wheezing, rhonchi or rales.  Abdominal:     General: There is no distension.     Palpations: Abdomen is soft.     Tenderness: There is no abdominal tenderness. There is no guarding or rebound.     Comments: No tenderness exhibited on exam.  No signs of infection or abscess in the proximity of the umbilicus.  Musculoskeletal:     Cervical back: Normal range of motion and neck supple.     Right lower leg: No edema.     Left lower leg: No edema.  Skin:    General: Skin is warm and dry.     Findings: No rash.  Neurological:     General: No focal deficit present.     Mental Status: She is alert. Mental status is at baseline.     Motor: No weakness.  Psychiatric:        Mood and Affect: Mood normal.     ED Results / Procedures / Treatments   Labs (all labs ordered are listed, but only abnormal results are displayed) Labs Reviewed  LIPASE, BLOOD  COMPREHENSIVE METABOLIC PANEL  CBC  URINALYSIS, ROUTINE W REFLEX MICROSCOPIC  I-STAT BETA HCG BLOOD, ED ([facility], [facility], [facility] ONLY)    EKG None  Radiology No results found.  Procedures Procedures (including critical care time)  Medications Ordered in ED Medications - No data to display  ED Course  I have reviewed the triage vital signs and the nursing notes.  Pertinent labs & imaging results that were available during my care of the patient were reviewed by me and considered in my medical decision making (see chart for details).  Patient seen  and examined.  Labs ordered by nursing protocol.  Awaiting results.  Will ensure that the patient is not pregnant.  If work-up is reassuring, anticipate symptomatic care and PCP follow-up.  At this time she does not have tenderness on palpation of her abdomen I do not feel that she requires CT imaging.  Vital signs reviewed and are as follows:  BP 116/72 (BP Location: Right Arm)   Pulse (!) 105   Temp 98.4 F (36.9 C) (Oral)   Resp 14   Ht 5\' 3"  (1.6 m)   Wt 74.8 kg   SpO2 100%   BMI 29.23 kg/m   3:23 PM labs and UA are reassuring.  Doubt UTI, urine contaminated due to menstrual bleeding.  Patient updated on all results. The patient was urged to return to the Emergency Department immediately with worsening of current symptoms, worsening abdominal pain, persistent vomiting, blood noted in stools, fever, or any other concerns. The patient verbalized understanding.   Dc with rx: colace and miralax.   Encourage PCP follow-up if not improving in 1 week.     MDM Rules/Calculators/A&P                          Patient with abdominal pain. Vitals are stable, no fever. Labs reassuring. Imaging not felt indicated at this time. No signs of dehydration, patient is tolerating PO's. Lungs are clear and no signs suggestive of PNA. Low concern for appendicitis, cholecystitis, pancreatitis, ruptured viscus, UTI, kidney stone, aortic dissection, aortic aneurysm or other emergent abdominal etiology. Supportive therapy indicated with return if symptoms worsen.    Final Clinical Impression(s) / ED Diagnoses Final diagnoses:  Periumbilical pain  Constipation, unspecified constipation type    Rx / DC Orders ED Discharge Orders         Ordered    polyethylene glycol powder (GLYCOLAX/MIRALAX) 17 GM/SCOOP powder  Daily        03/23/20 1451    docusate sodium (COLACE) 100 MG capsule  Every 12 hours        03/23/20 1451           03/25/20, PA-C 03/23/20 1524    03/25/20,  MD 03/25/20 1626

## 2020-08-16 ENCOUNTER — Ambulatory Visit (HOSPITAL_COMMUNITY)
Admission: EM | Admit: 2020-08-16 | Discharge: 2020-08-17 | Disposition: A | Discharge status: Home / Self Care | Payer: Medicaid Other | Attending: Student | Admitting: Student

## 2020-08-16 ENCOUNTER — Other Ambulatory Visit: Payer: Self-pay

## 2020-08-16 DIAGNOSIS — T43596A Underdosing of other antipsychotics and neuroleptics, initial encounter: Secondary | ICD-10-CM | POA: Insufficient documentation

## 2020-08-16 DIAGNOSIS — F23 Brief psychotic disorder: Secondary | ICD-10-CM

## 2020-08-16 DIAGNOSIS — R1084 Generalized abdominal pain: Secondary | ICD-10-CM | POA: Diagnosis not present

## 2020-08-16 DIAGNOSIS — F209 Schizophrenia, unspecified: Secondary | ICD-10-CM | POA: Diagnosis not present

## 2020-08-16 DIAGNOSIS — Z20822 Contact with and (suspected) exposure to covid-19: Secondary | ICD-10-CM | POA: Diagnosis not present

## 2020-08-16 DIAGNOSIS — Z818 Family history of other mental and behavioral disorders: Secondary | ICD-10-CM | POA: Insufficient documentation

## 2020-08-16 DIAGNOSIS — E860 Dehydration: Secondary | ICD-10-CM | POA: Insufficient documentation

## 2020-08-16 DIAGNOSIS — R531 Weakness: Secondary | ICD-10-CM | POA: Diagnosis not present

## 2020-08-16 DIAGNOSIS — Z91120 Patient's intentional underdosing of medication regimen due to financial hardship: Secondary | ICD-10-CM | POA: Insufficient documentation

## 2020-08-16 LAB — POCT URINE DRUG SCREEN - MANUAL ENTRY (I-SCREEN)
POC Amphetamine UR: NOT DETECTED
POC Buprenorphine (BUP): NOT DETECTED
POC Cocaine UR: NOT DETECTED
POC Marijuana UR: NOT DETECTED
POC Methadone UR: NOT DETECTED
POC Methamphetamine UR: NOT DETECTED
POC Morphine: NOT DETECTED
POC Oxazepam (BZO): NOT DETECTED
POC Oxycodone UR: NOT DETECTED
POC Secobarbital (BAR): NOT DETECTED

## 2020-08-16 LAB — POCT PREGNANCY, URINE: Preg Test, Ur: NEGATIVE

## 2020-08-16 LAB — POC SARS CORONAVIRUS 2 AG -  ED: SARS Coronavirus 2 Ag: NEGATIVE

## 2020-08-16 LAB — POC SARS CORONAVIRUS 2 AG: SARS Coronavirus 2 Ag: NEGATIVE

## 2020-08-16 MED ORDER — MAGNESIUM HYDROXIDE 400 MG/5ML PO SUSP: 30.0000 mL | Freq: Every day | ORAL | Status: DC | PRN

## 2020-08-16 MED ORDER — ALUM & MAG HYDROXIDE-SIMETH 200-200-20 MG/5ML PO SUSP: 30.0000 mL | ORAL | Status: DC | PRN

## 2020-08-16 MED ORDER — ONDANSETRON 4 MG PO TBDP: 4.0000 mg | ORAL_TABLET | Freq: Three times a day (TID) | ORAL | Status: DC | PRN

## 2020-08-16 MED ORDER — TRAZODONE HCL 50 MG PO TABS: 50.0000 mg | ORAL_TABLET | Freq: Every evening | ORAL | Status: DC | PRN

## 2020-08-16 MED ORDER — OLANZAPINE 5 MG PO TABS: 5.0000 mg | ORAL_TABLET | Freq: Every day | ORAL | Status: DC

## 2020-08-16 MED ORDER — HYDROXYZINE HCL 25 MG PO TABS: 25.0000 mg | ORAL_TABLET | Freq: Three times a day (TID) | ORAL | Status: DC | PRN

## 2020-08-16 MED ORDER — ACETAMINOPHEN 325 MG PO TABS: 650.0000 mg | ORAL_TABLET | Freq: Four times a day (QID) | ORAL | Status: DC | PRN
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
## 2020-08-16 NOTE — BH Assessment (Signed)
Comprehensive Clinical Assessment (CCA) Note  08/16/2020 Stacey SeatDestony Shontz [id number]   Disposition [place] Abtsody Taylor, PA, recommends overnight observation for safety and stabilization with psych reassessment in the AM. [facility] informed of disposition.  The patient demonstrates the following risk factors for suicide: Chronic risk factors for suicide include: psychiatric disorder of schizophrenia per patient. Acute risk factors for suicide include: family or marital conflict, unemployment and loss (financial, interpersonal, professional). Protective factors for this patient include: positive social support, responsibility to others (children, family), hope for the future and life satisfaction. Considering these factors, the overall suicide risk at this point appears to be low. Patient is appropriate for outpatient follow up.  No history  Stacey Guzman is a 25 year old female [facility] due to paranoia, hallucinations and off medications. Patient feels that someone is trying to set her up. Patient endorses seeing shadows and hearing voices during triage but does not give any details. Patient denies hallucinations with TTS clinician.  Patient reported being diagnosed with Schizophrenia last year, patient has not taken her medication in over 9 months because she states she was doing ok. Patient denies SI, HI and alcohol/drug usage.   Patient has her 25 year old daughter in the home. Patient reported her 25 year old daughter is presently with her aunt. 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 Patient reported no access to guns. Patient very anxious and cooperative during assessment. Patient appears to be holding her leg down, when asked, patient did not answer. Collateral contact, none reported.   Chief Complaint: Hx of  schizophrenia, paranoia, off medications  Visit Diagnosis: hx of schizophrenia per patient  CCA Screening, Triage and Referral (STR)  Patient Reported Information How did you hear about us? Self  Referral name: Self  Referral phone number: No data recorded  Whom do you see for routine medical problems? Primary Care  Practice/Facility Name: No data recorded Practice/Facility Phone Number: No data recorded Name of Contact: No data recorded Contact Number: No data recorded Contact Fax Number: No data recorded Prescriber Name: No data recorded Prescriber Address (if known): No data recorded  What Is the Reason for Your Visit/Call Today? Hx of schizophrenia, off medications  How Long Has This Been Causing You Problems? 1 wk - 1 month  What Do You Feel Would Help You the Most Today? -- ("I don't know")  Have You Recently Been in Any Inpatient Treatment (Hospital/Detox/Crisis [facility]/28-Day Program)? No  Name/Location of Program/Hospital:No data recorded How Long Were You There? No data recorded When Were You Discharged? No data recorded  Have You Ever Received Services From [facility] Before? No  Who Do You [facility]? No data recorded  Have You Recently Had Any Thoughts About Hurting Yourself? No  Are You Planning to Commit Suicide/Harm Yourself At This time? No  Have you Recently Had Thoughts About Hurting Someone Karolee Ohslse? No  Explanation: No data recorded  Have You Used Any Alcohol or Drugs in the Past 24 Hours? No  How Long Ago Did You Use Drugs or Alcohol? No data recorded What Did You Use and How Much? No data recorded  Do You Currently Have a Therapist/Psychiatrist? No  Name of Therapist/Psychiatrist: No data recorded  Have You Been Recently Discharged From Any Office Practice or Programs? No  Explanation of  Discharge From Practice/Program: No data recorded  CCA Screening Triage Referral Assessment Type of Contact: Face-to-Face  Is this Initial or  Reassessment? No data recorded Date Telepsych consult ordered in [facility]:  No data recorded Time Telepsych consult ordered in [facility]:  No data recorded  Patient Reported Information Reviewed? Yes  Patient Left Without Being Seen? No data recorded Reason for Not Completing Assessment: No data recorded  Collateral Involvement: none reported  Does Patient Have a Court Appointed Legal Guardian? No data recorded Name and Contact of Legal Guardian: No data recorded If Minor and Not Living with Parent(s), Who has Custody? No data recorded Is CPS involved or ever been involved? Never  Is APS involved or ever been involved? Never  Patient Determined To Be At Risk for Harm To Self or Others Based on Review of Patient Reported Information or Presenting Complaint? No  Method: No data recorded Availability of Means: No data recorded Intent: No data recorded Notification Required: No data recorded Additional Information for Danger to Others Potential: No data recorded Additional Comments for Danger to Others Potential: No data recorded Are There Guns or Other Weapons in Your Home? No data recorded Types of Guns/Weapons: No data recorded Are These Weapons Safely Secured?                            No data recorded Who Could Verify You Are Able To Have These Secured: No data recorded Do You Have any Outstanding Charges, Pending Court Dates, Parole/Probation? No data recorded Contacted To Inform of Risk of Harm To Self or Others: No data recorded  Location of Assessment: [facility]  Does Patient Present under Involuntary Commitment? No  IVC Papers Initial File Date: No data recorded  [place] of Residence: Stacey Guzman  Patient Currently Receiving the Following Services: Not Receiving Services  Determination of Need: Urgent (48 hours)  Options For Referral: Medication Management; Outpatient Therapy  CCA Biopsychosocial Intake/Chief Complaint:  Hx of schizophrenia, hallucinations and  off medications  Current Symptoms/Problems: Visual and auditory hallucinations  Patient Reported Schizophrenia/Schizoaffective Diagnosis in Past: Yes  Strengths: self-awareness  Preferences: uta  Abilities: uta  Type of Services Patient Feels are Needed: No data recorded  Initial Clinical Notes/Concerns: No data recorded  Mental Health Symptoms Depression:  None   Duration of Depressive symptoms: No data recorded  Mania:  None   Anxiety:   Restlessness; Sleep; Worrying; Tension   Psychosis:  Delusions; Hallucinations   Duration of Psychotic symptoms: Less than six months   Trauma:  None   Obsessions:  None   Compulsions:  None   Inattention:  None   Hyperactivity/Impulsivity:  N/A   Oppositional/Defiant Behaviors:  None   Emotional Irregularity:  None   Other Mood/Personality Symptoms:  No data recorded   Mental Status Exam Appearance and self-care  Stature:  Average   Weight:  Average weight   Clothing:  Neat/clean   Grooming:  Normal   Cosmetic use:  None   Posture/gait:  Normal   Motor activity:  Not Remarkable   Sensorium  Attention:  Normal   Concentration:  Anxiety interferes   Orientation:  X5   Recall/memory:  Normal   Affect and Mood  Affect:  Appropriate; Anxious   Mood:  Anxious   Relating  Eye contact:  Normal   Facial expression:  Sad   Attitude toward examiner:  Cooperative   Thought and Language  Speech flow: Clear and Coherent  Thought content:  Appropriate to Mood and Circumstances   Preoccupation:  None   Hallucinations:  Auditory; Visual   Organization:  No data recorded  [employer] of Knowledge:  Average   Intelligence:  Average   Abstraction:  Normal   Judgement:  Fair   Dance movement psychotherapist:  No data recorded  Insight:  Fair   Decision Making:  Confused   Social Functioning  Social Maturity:  Responsible   Social Judgement:  No data recorded  Stress  Stressors:  Family  conflict; Transitions; Financial   Coping Ability:  Overwhelmed; Exhausted   Skill Deficits:  Self-control   Supports:  Family    Religion: Religion/Spirituality Are You A Religious Person?:  Industrial/product designer)  Leisure/Recreation: Leisure / Recreation Do You Have Hobbies?: Yes Leisure and Hobbies: listening to music, "I use to work out" and do homework for school  Exercise/Diet: Exercise/Diet Do You Exercise?: No Do You Have Any Trouble Sleeping?: Yes Explanation of Sleeping Difficulties: intermittant awakenings  CCA Employment/Education Employment/Work Situation: Employment / Work Environmental consultant job has been impacted by current illness: No Has patient ever been in the [employer]?: No  Education: Education Is Patient Currently Attending School?: Yes School Currently Attending: [employer] Last Grade Completed: 13 Did Garment/textile technologist From [employer]?: Yes Did Theme park manager?: Yes What Type of College Degree Do you Have?: [employer] in Nursing What Was Your Major?: Nursing Did You Have An Individualized Education Program (IIEP): No Did You Have Any Difficulty At School?: No Patient's Education Has Been Impacted by Current Illness: No  CCA Family/Childhood History Family and Relationship History: Family history Does patient have children?: Yes How many children?: 1 How is patient's relationship with their children?: very good  Childhood History:  Childhood History Additional childhood history information: uta Description of patient's relationship with caregiver when they were a child: uta Patient's description of current relationship with people who raised him/her: uta How were you disciplined when you got in trouble as a child/adolescent?: uta Does patient have siblings?: Yes Number of Siblings: 1 Description of patient's current relationship with siblings: good Did patient suffer any verbal/emotional/physical/sexual abuse as a child?: No Did patient suffer from severe childhood  neglect?: No Has patient ever been sexually abused/assaulted/raped as an adolescent or adult?: No Was the patient ever a victim of a crime or a disaster?: No Witnessed domestic violence?: No Has patient been affected by domestic violence as an adult?: No  Child/Adolescent Assessment:   CCA Substance Use Alcohol/Drug Use: Alcohol / Drug Use Pain Medications: see MAR Prescriptions: see MAR Over the Counter: see MAR History of alcohol / drug use?: No history of alcohol / drug abuse   ASAM's:  Six Dimensions of Multidimensional Assessment  Dimension 1:  Acute Intoxication and/or Withdrawal Potential:      Dimension 2:  Biomedical Conditions and Complications:      Dimension 3:  Emotional, Behavioral, or Cognitive Conditions and Complications:     Dimension 4:  Readiness to Change:     Dimension 5:  Relapse, Continued use, or Continued Problem Potential:     Dimension 6:  Recovery/Living Environment:     ASAM Severity Score:    ASAM Recommended Level of Treatment:     Substance use Disorder (SUD)   Recommendations for Services/Supports/Treatments: Recommendations for Services/Supports/Treatments Recommendations For Services/Supports/Treatments: Medication Management  DSM5 Diagnoses: Patient Active Problem List   Diagnosis Date Noted  . Fever of unknown origin following delivery, postpartum 03/27/2016  . S/P C-section 03/26/2016  Patient Centered Plan: Patient is on the following Treatment Plan(s):    Referrals to Alternative Service(s): Referred to Alternative Service(s):   Place:   Date:   Time:    Referred to Alternative Service(s):   Place:   Date:   Time:    Referred to Alternative Service(s):   Place:   Date:   Time:    Referred to Alternative Service(s):   Place:   Date:   Time:     Burnetta Sabin, [facility]

## 2020-08-17 ENCOUNTER — Ambulatory Visit (HOSPITAL_COMMUNITY)
Admission: EM | Admit: 2020-08-17 | Discharge: 2020-08-18 | Disposition: A | Discharge status: Home / Self Care | Payer: Medicaid Other | Source: Home / Self Care | Attending: Psychiatry | Admitting: Psychiatry

## 2020-08-17 ENCOUNTER — Other Ambulatory Visit: Payer: Self-pay

## 2020-08-17 DIAGNOSIS — R1084 Generalized abdominal pain: Secondary | ICD-10-CM | POA: Diagnosis not present

## 2020-08-17 DIAGNOSIS — Z818 Family history of other mental and behavioral disorders: Secondary | ICD-10-CM | POA: Insufficient documentation

## 2020-08-17 DIAGNOSIS — R531 Weakness: Secondary | ICD-10-CM | POA: Diagnosis not present

## 2020-08-17 DIAGNOSIS — T43596A Underdosing of other antipsychotics and neuroleptics, initial encounter: Secondary | ICD-10-CM | POA: Insufficient documentation

## 2020-08-17 DIAGNOSIS — Z91120 Patient's intentional underdosing of medication regimen due to financial hardship: Secondary | ICD-10-CM | POA: Insufficient documentation

## 2020-08-17 DIAGNOSIS — F23 Brief psychotic disorder: Secondary | ICD-10-CM | POA: Diagnosis not present

## 2020-08-17 DIAGNOSIS — Z20822 Contact with and (suspected) exposure to covid-19: Secondary | ICD-10-CM | POA: Insufficient documentation

## 2020-08-17 DIAGNOSIS — F209 Schizophrenia, unspecified: Secondary | ICD-10-CM

## 2020-08-17 LAB — LIPID PANEL
Cholesterol: 122 mg/dL (ref 0–200)
HDL: 56 mg/dL (ref >40)
LDL Cholesterol: 60 mg/dL (ref 0–99)
Total CHOL/HDL Ratio: 2.2 RATIO
Triglycerides: 30 mg/dL (ref <150)
VLDL: 6 mg/dL (ref 0–40)

## 2020-08-17 LAB — POCT URINE DRUG SCREEN - MANUAL ENTRY (I-SCREEN)
POC Amphetamine UR: NOT DETECTED
POC Buprenorphine (BUP): NOT DETECTED
POC Cocaine UR: NOT DETECTED
POC Marijuana UR: NOT DETECTED
POC Methadone UR: NOT DETECTED
POC Methamphetamine UR: NOT DETECTED
POC Morphine: NOT DETECTED
POC Oxazepam (BZO): NOT DETECTED
POC Oxycodone UR: NOT DETECTED
POC Secobarbital (BAR): NOT DETECTED

## 2020-08-17 LAB — CBC WITH DIFFERENTIAL/PLATELET
Abs Immature Granulocytes: 0.06 10*3/uL (ref 0.00–0.07)
Basophils Absolute: 0.1 10*3/uL (ref 0.0–0.1)
Basophils Relative: 1 %
Eosinophils Absolute: 0 10*3/uL (ref 0.0–0.5)
Eosinophils Relative: 0 %
HCT: 38.7 % (ref 36.0–46.0)
Hemoglobin: 12.5 g/dL (ref 12.0–15.0)
Immature Granulocytes: 1 %
Lymphocytes Relative: 18 %
Lymphs Abs: 2.2 10*3/uL (ref 0.7–4.0)
MCH: 27.7 pg (ref 26.0–34.0)
MCHC: 32.3 g/dL (ref 30.0–36.0)
MCV: 85.6 fL (ref 80.0–100.0)
Monocytes Absolute: 0.8 10*3/uL (ref 0.1–1.0)
Monocytes Relative: 7 %
Neutro Abs: 8.9 10*3/uL — ABNORMAL HIGH (ref 1.7–7.7)
Neutrophils Relative %: 73 %
Platelets: 322 10*3/uL (ref 150–400)
RBC: 4.52 MIL/uL (ref 3.87–5.11)
RDW: 13.7 % (ref 11.5–15.5)
WBC: 12.1 10*3/uL — ABNORMAL HIGH (ref 4.0–10.5)
nRBC: 0 % (ref 0.0–0.2)

## 2020-08-17 LAB — POC SARS CORONAVIRUS 2 AG: SARS Coronavirus 2 Ag: NEGATIVE

## 2020-08-17 LAB — COMPREHENSIVE METABOLIC PANEL
ALT: 18 U/L (ref 0–44)
AST: 23 U/L (ref 15–41)
Albumin: 4.3 g/dL (ref 3.5–5.0)
Alkaline Phosphatase: 61 U/L (ref 38–126)
Anion gap: 9 (ref 5–15)
BUN: 8 mg/dL (ref 6–20)
CO2: 24 mmol/L (ref 22–32)
Calcium: 9.3 mg/dL (ref 8.9–10.3)
Chloride: 102 mmol/L (ref 98–111)
Creatinine, Ser: 0.94 mg/dL (ref 0.44–1.00)
GFR, Estimated: >60 mL/min (ref >60)
Glucose, Bld: 77 mg/dL (ref 70–99)
Potassium: 4.2 mmol/L (ref 3.5–5.1)
Sodium: 135 mmol/L (ref 135–145)
Total Bilirubin: 1.3 mg/dL — ABNORMAL HIGH (ref 0.3–1.2)
Total Protein: 7.8 g/dL (ref 6.5–8.1)

## 2020-08-17 LAB — HEMOGLOBIN A1C
Hgb A1c MFr Bld: 5.7 % — ABNORMAL HIGH (ref 4.8–5.6)
Mean Plasma Glucose: 116.89 mg/dL

## 2020-08-17 LAB — RESP PANEL BY RT-PCR (FLU A&B, COVID) ARPGX2
Influenza A by PCR: NEGATIVE
Influenza B by PCR: NEGATIVE
SARS Coronavirus 2 by RT PCR: NEGATIVE

## 2020-08-17 LAB — POCT PREGNANCY, URINE: Preg Test, Ur: NEGATIVE

## 2020-08-17 LAB — TSH: TSH: 2.874 u[IU]/mL (ref 0.350–4.500)

## 2020-08-17 LAB — ETHANOL: Alcohol, Ethyl (B): <10 mg/dL (ref <10)

## 2020-08-17 MED ORDER — TRAZODONE HCL 50 MG PO TABS: 50.0000 mg | ORAL_TABLET | Freq: Every evening | ORAL | Status: DC | PRN

## 2020-08-17 MED ORDER — RISPERIDONE 1 MG PO TABS
1.0000 mg | ORAL_TABLET | Freq: Two times a day (BID) | ORAL | Status: DC
  Administered 2020-08-17 – 2020-08-18 (×3): 1 mg via ORAL
  Filled 2020-08-17 (×2): qty 1
  Filled 2020-08-17: qty 14
  Filled 2020-08-17: qty 1

## 2020-08-17 MED ORDER — HYDROXYZINE HCL 25 MG PO TABS
25.0000 mg | ORAL_TABLET | Freq: Three times a day (TID) | ORAL | Status: DC | PRN
Start: 2020-08-17 — End: 2020-08-18

## 2020-08-17 MED ORDER — ACETAMINOPHEN 325 MG PO TABS: 650.0000 mg | ORAL_TABLET | Freq: Four times a day (QID) | ORAL | Status: DC | PRN

## 2020-08-17 MED ORDER — MAGNESIUM HYDROXIDE 400 MG/5ML PO SUSP: 30.0000 mL | Freq: Every day | ORAL | Status: DC | PRN

## 2020-08-17 MED ORDER — ALUM & MAG HYDROXIDE-SIMETH 200-200-20 MG/5ML PO SUSP
30.0000 mL | ORAL | Status: DC | PRN
Start: 2020-08-17 — End: 2020-08-18

## 2020-08-17 NOTE — ED Notes (Signed)
25 yo female admitted to continuous observation unit due to endorsing AVH. Pt states, "I see images on the floor that isn't there and I hear people talking around me and scolding me at times. I stopped my medicine awhile ago. I think I just need to get back on some meds". Pt denies SI/HI. Pleasant and cooperative throughout assessment. Will monitor for safety.

## 2020-08-17 NOTE — ED Notes (Signed)
LOCKER #[id number]

## 2020-08-17 NOTE — ED Notes (Signed)
Pt given meal
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
## 2020-08-17 NOTE — Discharge Instructions (Signed)
  Discharge recommendations:  Patient is to take medications as prescribed. Please see information for follow-up appointment with psychiatry and therapy. Please follow up with your primary care provider for all medical related needs.   Therapy: We recommend that patient participate in individual therapy to address mental health concerns.  Medications: The patient is to contact a medical professional and/or outpatient provider to address any new side effects that develop. Patient should update outpatient providers of any new medications and/or medication changes.   Safety:  The patient should abstain from use of illicit substances/drugs and abuse of any medications. If symptoms worsen or do not continue to improve or if the patient becomes actively suicidal or homicidal then it is recommended that the patient return to the closest hospital emergency department, the [facility], or call 911 for further evaluation and treatment. National Suicide Prevention Lifeline 1-800-SUICIDE or 1-800-273-8255.  

## 2020-08-17 NOTE — ED Provider Notes (Signed)
Behavioral Health Admission [facility] & OBS)  Date: 08/17/20 Patient Name: Stacey Guzman MRN: [id number] Chief Complaint:  Chief Complaint  Patient presents with  . Urgent emergent evaluation   Chief Complaint/Presenting Problem: Hx of schizophrenia, hallucinations and off medications  Diagnoses:  Final diagnoses:  Schizophrenia, unspecified type (HCC)    HPI:   Stacey Guzman is a 25 y.o. female with reported history of schizophrenia who presents to [facility] via [facility] for psychotic symptoms. Per chart review, patient presented to the [facility] last night for a similar reason, overnight observation was recommended however patient declined and was discharged home.  On interview, patient is distracted, displays increased latency of response and smiles inappropriately intermittently. She states that she came back to the [facility] for "hearing voices and stuff". She goes on to say that she moved to [place] about a  Month ago in an apartment with her daughter and that about 2 weeks ago she became to feel like she was being ""set up" and describes that the father of her daughter and her cousin are trying to "set me up". I. She has difficulty describing this but states that "someone is going to do something to me". She states that it has been at least 5 months since she took abilify and states that she received abilify LAI on one occasion although did not follow up to receive it after that. When discussing patient's presentation to the [facility] yesterday, she states that "someone blew smoke into the room" during her assessment yesterday and begins to laugh.  . She reports previous psychiatric hospitalizations but is unable to state when or where these hospitalizations occurred. She denies substance use, legal history. She denies SI/HI. She reports VH of voices that instruct her to do things but denies that they instruct her to harm herself or others. She denies VH. Patient is very distracted throughout assessment and  appears to have difficulty expressing her thoughts. She reports family history of mental illness-cousin and uncle with schizophrenia and grandmother with schizophrenia or bipolar. Pt states that she has not slept in several days which is in part d/t paranoia. She is amenable to admission and re-initiating medications. Discussed restarting abilify vs starting a different medication. Pt cites cost as a concern for per as she was unable to afford abilify. Pt amenable to start risperdal 1 mg BID.  Per chart review (see note from [facility]), patient presented yesterday in a similar fashion with delusional and paranoid TC of being set up. She also reported at that time that the father of her daughter and her cousin were putting things in her food and drink. At that time patient declined to stay and was discharged. See full note from visit for details  TTS attempted to contact aunt for collateral information, but collateral did not answer phone. See TTS note   PHQ 2-9:   Flowsheet Row ED from 08/17/2020 in [facility]  C-SSRS RISK CATEGORY No Risk       Total Time spent with patient: 30 minutes  Musculoskeletal  Strength & Muscle Tone: within normal limits Gait & Station: normal Patient leans: N/A  Psychiatric Specialty Exam  Presentation General Appearance: Bizarre; Fairly Groomed  Eye Contact:Fleeting  Speech:Clear and Coherent; Normal Rate (increased latency of response)  Speech Volume:Normal  Handedness:No data recorded  Mood and Affect  Mood:Anxious  Affect:Other (comment); Constricted (inappropriate laughter and smiling intermittently)   Thought Process  Thought Processes:Coherent  Descriptions of Associations:Intact  Orientation:Full (Time, Place and  Person)  Thought Content:Paranoid Ideation; Delusions  Diagnosis of Schizophrenia or Schizoaffective disorder in past: Yes  Duration of Psychotic Symptoms: Less than six  months  Hallucinations:Hallucinations: Other (comment); Auditory (appears distracted throughout assessment) Description of Auditory Hallucinations: voices that instruct her to do things  Ideas of Reference:Delusions; Paranoia  Suicidal Thoughts:Suicidal Thoughts: No  Homicidal Thoughts:Homicidal Thoughts: No   Sensorium  Memory:Immediate Fair; Recent Fair; Remote Fair  Judgment:Poor  Insight:Lacking   Executive Functions  Concentration:Fair  Attention Span:Poor  Recall:Fair  Fund of Knowledge:Fair  Language:Fair   Psychomotor Activity  Psychomotor Activity:Psychomotor Activity: Normal   Assets  Assets:Communication Skills; Social Support; Vocational/Educational   Sleep  Sleep:Sleep: Poor   Nutritional Assessment (For OBS and [facility]BC admissions only) Has the patient had a weight loss or gain of 10 pounds or more in the last 3 months?: No Has the patient had a decrease in food intake/or appetite?: No Does the patient have eating habits or behaviors that may be indicators of an eating disorder including binging or inducing vomiting?: No Has the patient recently lost weight without trying?: No Has the patient been eating poorly because of a decreased appetite?: No Malnutrition Screening Tool Score: 0    Physical Exam Constitutional:      Appearance: Normal appearance. She is normal weight.  HENT:     Head: Normocephalic and atraumatic.  Pulmonary:     Effort: Pulmonary effort is normal.  Neurological:     Mental Status: She is alert.    Review of Systems  Constitutional: Negative for chills and fever.  Eyes: Negative for discharge.  Respiratory: Negative for cough.   Cardiovascular: Negative for chest pain.  Gastrointestinal: Negative for nausea.  Musculoskeletal: Negative for myalgias.  Neurological: Negative for headaches.  Psychiatric/Behavioral: Positive for hallucinations. Negative for depression and suicidal ideas.    Blood pressure (!)  136/101, pulse 100, temperature 98.5 F (36.9 C), temperature source Oral, resp. rate 18, SpO2 100 %, unknown if currently breastfeeding. There is no height or weight on file to calculate BMI.  Past Psychiatric History: schizophrenia   Is the patient at risk to self? No  Has the patient been a risk to self in the past 6 months? No .    Has the patient been a risk to self within the distant past? No   Is the patient a risk to others? No   Has the patient been a risk to others in the past 6 months? No   Has the patient been a risk to others within the distant past? No   Past Medical History:  Past Medical History:  Diagnosis Date  . Medical history non-contributory   . Schizophrenia [facility](HCC)     Past Surgical History:  Procedure Laterality Date  . ADENOIDECTOMY    . CESAREAN SECTION N/A 03/26/2016   Procedure: PRIMARY CESAREAN SECTION;  Surgeon: Jaymes GraffNaima Dillard, MD;  Location: [facility];  Service: Obstetrics;  Laterality: N/A;  Provider requests RNFA -ap  . TONSILLECTOMY      Family History:  Family History  Problem Relation Age of Onset  . Hypertension Maternal Grandfather   . Hypertension Paternal Grandfather   . Diabetes Mother   . Hypertension Mother   . Asthma Father   . Hypertension Father     Social History:  Social History   Socioeconomic History  . Marital status: Single    Spouse name: Not on file  . Number of children: Not on file  . Years of education: Not on  file  . Highest education level: Not on file  Occupational History  . Not on file  Tobacco Use  . Smoking status: Never Smoker  . Smokeless tobacco: Never Used  Vaping Use  . Vaping Use: Never used  Substance and Sexual Activity  . Alcohol use: Yes  . Drug use: Yes    Types: Marijuana  . Sexual activity: Yes    Birth control/protection: None  Other Topics Concern  . Not on file  Social History Narrative  . Not on file   Social Determinants of Health   Financial Resource Strain: Not  on file  Food Insecurity: Not on file  Transportation Needs: Not on file  Physical Activity: Not on file  Stress: Not on file  Social Connections: Not on file  Intimate Partner Violence: Not on file    SDOH:  SDOH Screenings   Alcohol Screen: Not on file  Depression (PNT6-1): Not on file  Financial Resource Strain: Not on file  Food Insecurity: Not on file  Housing: Not on file  Physical Activity: Not on file  Social Connections: Not on file  Stress: Not on file  Tobacco Use: Low Risk   . Smoking Tobacco Use: Never Smoker  . Smokeless Tobacco Use: Never Used  Transportation Needs: Not on file    Last Labs:  Admission on 08/17/2020  Component Date Value Ref Range Status  . POC Amphetamine UR 08/17/2020 None Detected  NONE DETECTED (Cut Off Level 1000 ng/mL) Final  . POC Secobarbital (BAR) 08/17/2020 None Detected  NONE DETECTED (Cut Off Level 300 ng/mL) Final  . POC Buprenorphine (BUP) 08/17/2020 None Detected  NONE DETECTED (Cut Off Level 10 ng/mL) Final  . POC Oxazepam (BZO) 08/17/2020 None Detected  NONE DETECTED (Cut Off Level 300 ng/mL) Final  . POC Cocaine UR 08/17/2020 None Detected  NONE DETECTED (Cut Off Level 300 ng/mL) Final  . POC Methamphetamine UR 08/17/2020 None Detected  NONE DETECTED (Cut Off Level 1000 ng/mL) Final  . POC Morphine 08/17/2020 None Detected  NONE DETECTED (Cut Off Level 300 ng/mL) Final  . POC Oxycodone UR 08/17/2020 None Detected  NONE DETECTED (Cut Off Level 100 ng/mL) Final  . POC Methadone UR 08/17/2020 None Detected  NONE DETECTED (Cut Off Level 300 ng/mL) Final  . POC Marijuana UR 08/17/2020 None Detected  NONE DETECTED (Cut Off Level 50 ng/mL) Final  . SARS Coronavirus 2 Ag 08/17/2020 NEGATIVE  NEGATIVE Final   Comment: (NOTE) SARS-CoV-2 antigen NOT DETECTED.   Negative results are presumptive.  Negative results do not preclude SARS-CoV-2 infection and should not be used as the sole basis for treatment or other patient management  decisions, including infection  control decisions, particularly in the presence of clinical signs and  symptoms consistent with COVID-19, or in those who have been in contact with the virus.  Negative results must be combined with clinical observations, patient history, and epidemiological information. The expected result is Negative.  Fact Sheet for Patients: https://www.jennings-kim.com/  Fact Sheet for Healthcare Providers: https://alexander-rogers.biz/  This test is not yet approved or cleared by the [place] FDA and  has been authorized for detection and/or diagnosis of SARS-CoV-2 by FDA under an Emergency Use Authorization (EUA).  This EUA will remain in effect (meaning this test can be used) for the duration of  the COV                          ID-19 declaration under Section  564(b)(1) of the Act, 21 U.S.C. section 360bbb-3(b)(1), unless the authorization is terminated or revoked sooner.    . Preg Test, Ur 08/17/2020 NEGATIVE  NEGATIVE Final   Comment:        THE SENSITIVITY OF THIS METHODOLOGY IS >24 mIU/mL   Admission on 08/16/2020, Discharged on 08/17/2020  Component Date Value Ref Range Status  . SARS Coronavirus 2 Ag 08/16/2020 Negative  Negative Final  . POC Amphetamine UR 08/16/2020 None Detected  NONE DETECTED (Cut Off Level 1000 ng/mL) Final  . POC Secobarbital (BAR) 08/16/2020 None Detected  NONE DETECTED (Cut Off Level 300 ng/mL) Final  . POC Buprenorphine (BUP) 08/16/2020 None Detected  NONE DETECTED (Cut Off Level 10 ng/mL) Final  . POC Oxazepam (BZO) 08/16/2020 None Detected  NONE DETECTED (Cut Off Level 300 ng/mL) Final  . POC Cocaine UR 08/16/2020 None Detected  NONE DETECTED (Cut Off Level 300 ng/mL) Final  . POC Methamphetamine UR 08/16/2020 None Detected  NONE DETECTED (Cut Off Level 1000 ng/mL) Final  . POC Morphine 08/16/2020 None Detected  NONE DETECTED (Cut Off Level 300 ng/mL) Final  . POC Oxycodone UR 08/16/2020 None  Detected  NONE DETECTED (Cut Off Level 100 ng/mL) Final  . POC Methadone UR 08/16/2020 None Detected  NONE DETECTED (Cut Off Level 300 ng/mL) Final  . POC Marijuana UR 08/16/2020 None Detected  NONE DETECTED (Cut Off Level 50 ng/mL) Final  . SARS Coronavirus 2 Ag 08/16/2020 NEGATIVE  NEGATIVE Final   Comment: (NOTE) SARS-CoV-2 antigen NOT DETECTED.   Negative results are presumptive.  Negative results do not preclude SARS-CoV-2 infection and should not be used as the sole basis for treatment or other patient management decisions, including infection  control decisions, particularly in the presence of clinical signs and  symptoms consistent with COVID-19, or in those who have been in contact with the virus.  Negative results must be combined with clinical observations, patient history, and epidemiological information. The expected result is Negative.  Fact Sheet for Patients: https://www.jennings-kim.com/  Fact Sheet for Healthcare Providers: https://alexander-rogers.biz/  This test is not yet approved or cleared by the [place] FDA and  has been authorized for detection and/or diagnosis of SARS-CoV-2 by FDA under an Emergency Use Authorization (EUA).  This EUA will remain in effect (meaning this test can be used) for the duration of  the COV                          ID-19 declaration under Section 564(b)(1) of the Act, 21 U.S.C. section 360bbb-3(b)(1), unless the authorization is terminated or revoked sooner.    . Preg Test, Ur 08/16/2020 NEGATIVE  NEGATIVE Final   Comment:        THE SENSITIVITY OF THIS METHODOLOGY IS >24 mIU/mL   Admission on 03/23/2020, Discharged on 03/23/2020  Component Date Value Ref Range Status  . Lipase 03/23/2020 27  11 - 51 U/L Final   Performed at [facility], [address], [place], [place] [address]  . Sodium 03/23/2020 137  135 - 145 mmol/L Final  . Potassium 03/23/2020 4.1  3.5 - 5.1 mmol/L Final  .  Chloride 03/23/2020 103  98 - 111 mmol/L Final  . CO2 03/23/2020 28  22 - 32 mmol/L Final  . Glucose, Bld 03/23/2020 99  70 - 99 mg/dL Final   Glucose reference range applies only to samples taken after fasting for at least 8 hours.  . BUN 03/23/2020 9  6 - 20 mg/dL Final  . Creatinine, Ser 03/23/2020 0.86  0.44 - 1.00 mg/dL Final  . Calcium 19/14/7829 8.8* 8.9 - 10.3 mg/dL Final  . Total Protein 03/23/2020 6.6  6.5 - 8.1 g/dL Final  . Albumin 56/21/3086 3.4* 3.5 - 5.0 g/dL Final  . AST 57/84/6962 18  15 - 41 U/L Final  . ALT 03/23/2020 12  0 - 44 U/L Final  . Alkaline Phosphatase 03/23/2020 48  38 - 126 U/L Final  . Total Bilirubin 03/23/2020 1.1  0.3 - 1.2 mg/dL Final  . GFR, Estimated 03/23/2020 >60  >60 mL/min Final   Comment: (NOTE) Calculated using the CKD-EPI Creatinine Equation (2021)   . Anion gap 03/23/2020 6  5 - 15 Final   Performed at [facility], [address] [place], [place] [address]  . WBC 03/23/2020 7.4  4.0 - 10.5 K/uL Final  . RBC 03/23/2020 4.18  3.87 - 5.11 MIL/uL Final  . Hemoglobin 03/23/2020 11.8* 12.0 - 15.0 g/dL Final  . HCT 13/24/4010 37.3  36.0 - 46.0 % Final  . MCV 03/23/2020 89.2  80.0 - 100.0 fL Final  . MCH 03/23/2020 28.2  26.0 - 34.0 pg Final  . MCHC 03/23/2020 31.6  30.0 - 36.0 g/dL Final  . RDW 27/25/3664 13.2  11.5 - 15.5 % Final  . Platelets 03/23/2020 249  150 - 400 K/uL Final  . nRBC 03/23/2020 0.0  0.0 - 0.2 % Final   Performed at [facility], [address], [place], [place] [address]  . Color, Urine 03/23/2020 YELLOW  YELLOW Final  . APPearance 03/23/2020 HAZY* CLEAR Final  . Specific Gravity, Urine 03/23/2020 1.021  1.005 - 1.030 Final  . pH 03/23/2020 6.0  5.0 - 8.0 Final  . Glucose, UA 03/23/2020 NEGATIVE  NEGATIVE mg/dL Final  . Hgb urine dipstick 03/23/2020 LARGE* NEGATIVE Final  . Bilirubin Urine 03/23/2020 NEGATIVE  NEGATIVE Final  . Ketones, ur 03/23/2020 NEGATIVE  NEGATIVE mg/dL Final  . Protein, ur 42/59/5638  NEGATIVE  NEGATIVE mg/dL Final  . Nitrite 75/64/3329 NEGATIVE  NEGATIVE Final  . Leukocytes,Ua 03/23/2020 TRACE* NEGATIVE Final  . RBC / HPF 03/23/2020 >50* 0 - 5 RBC/hpf Final  . WBC, UA 03/23/2020 11-20  0 - 5 WBC/hpf Final  . Bacteria, UA 03/23/2020 RARE* NONE SEEN Final  . Squamous Epithelial / LPF 03/23/2020 0-5  0 - 5 Final  . Mucus 03/23/2020 PRESENT   Final   Performed at [facility], [address], [place], [place] [address]  . I-stat hCG, quantitative 03/23/2020 <5.0  <5 mIU/mL Final  . Comment 3 03/23/2020          Final   Comment:   GEST. AGE      CONC.  (mIU/mL)   <=1 WEEK        5 - 50     2 WEEKS       50 - 500     3 WEEKS       100 - 10,000     4 WEEKS     1,000 - 30,000        FEMALE AND NON-PREGNANT FEMALE:     LESS THAN 5 mIU/mL     Allergies: Patient has no known allergies.  PTA Medications: (Not in a hospital admission)   Medical Decision Making  Patient with dx of schizophrenia who has been off of medications for several months presents with paranoia and delusions. She is  acutely psychotic and is amenable for admission for observation for safety and stabilization and to reinitiate medications.  schizophrenia  -start risperdal 1 mg BID for psychosis Routine Labs ordered- CBC, CMP, TSH, a1c, lipid panel, ethanol, UDS, pregnancy test, EKG.     Recommendations  Based on my evaluation the patient does not appear to have an emergency medical condition.  Estella Husk, MD 08/17/20  1:21 PM

## 2020-08-17 NOTE — ED Notes (Signed)
Pt signing papers to  leave AMA. She does not want to come back on the unit

## 2020-08-17 NOTE — ED Notes (Signed)
Patient arrived on unit.
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
## 2020-08-17 NOTE — Progress Notes (Signed)
Patient resting, eyes closed, respirations even and unlabored. No objective signs of anxiety or discomfort.

## 2020-08-17 NOTE — ED Notes (Signed)
Pt sitting up in chair looking at others suspiciously. Reassured pt of her safety. Denies SI/HI. Continue to endorse AH of people talking around her. Informed pt to notify staff with any needs or concerns. Safety maintained.

## 2020-08-17 NOTE — BH Assessment (Signed)
Triage Note: URGENT- Pt has hx of schizophrenia; reports she has been off of Abilify for about 5 months. Pt reporting AVH and paranoia- thought her child's father was trying to kill the child. Per police she was also driving around town trying to find child who pt thought was kidnapped. Denies current SI and HI.

## 2020-08-17 NOTE — BH Assessment (Addendum)
Disposition Melbourne Abts, PA, recommends overnight observation for safety and stabilization with psych reassessment in the AM. [facility] RN informed of disposition. Patient requesting to leave AMA. Patient changed her mind and wants to leave [facility]. Melbourne Abts, NP, RN and [facility] all notified of patient requests.

## 2020-08-18 DIAGNOSIS — F209 Schizophrenia, unspecified: Secondary | ICD-10-CM | POA: Diagnosis not present

## 2020-08-18 MED ORDER — RISPERIDONE 1 MG PO TABS: 1.0000 mg | ORAL_TABLET | Freq: Two times a day (BID) | ORAL | 0 refills | Status: DC

## 2020-08-18 NOTE — ED Notes (Signed)
Pt sleeping at present, no distress noted, monitoring for safety. 

## 2020-08-18 NOTE — Discharge Summary (Signed)
Larey Seat to be D/C'd home per MD order. Discussed with the patient and all questions fully answered. An After Visit Summary was printed and given to the patient. Patient escorted out and D/C home via safe transport. Dickie La  08/18/2020 10:39 AM

## 2020-08-18 NOTE — Progress Notes (Signed)
Pt is alert and oriented. Pt did not voice any complaints of pain or discomfort. Pt denies SI, HI and AVH at this time. Staff will monitor for pt's safety. 

## 2020-08-18 NOTE — ED Provider Notes (Signed)
FBC/OBS ASAP Discharge Summary  Date and Time: 08/18/2020 3:22 PM  Name: Stacey Guzman  MRN:  [id number]   Discharge Diagnoses:  Final diagnoses:  Schizophrenia, unspecified type [facility])    Subjective:  Chart reviewed. Patient has been medication compliant at the [facility]. Patient interviewed this AM- she is calm, cooperative and pleasant. She denies SI/HI/AVH and reports sleeping well. She states that the last time she experienced AH was prior to admission to the [facility]. No delusional or paranoid thought content elicited or volunteered on exam. She does not appear to be RIS this AM and less difficulty holding a conversation. She states that she feels that the medication works well and helped her sleep. She has multiple questions about open access hours for walk in appointments that was mentioned yesterday. She states that she feels ready for discharge and requests a school note. 7 day sample of risperdal and school note provided. Open access information provided in AVS  On my interview, patient is in NAD, alert, oriented, calm, cooperative, and attentive, with normal affect, speech, and behavior. Objectively, there is no evidence of psychosis/ mania (able to converse coherently, linear and goal directed thought, no RIS, no distractibility, not pre-occupied, no FOI, etc) nor depression to the point of suicidality (able to concentrate, affect full and reactive, speech normal r/v/t, no psychomotor retardation/agitation, etc).  Stay Summary:  Alyse Kathan a 25 y.o.femalewith reported history of schizophrenia who [facility]. Per chart review, patient presented to the [facility] last night for a similar reason, overnight observation was recommended however patient declined and was discharged home.  On interview, patient is distracted, displays increased latency of response and smiles inappropriately intermittently. She states that she came back to the [facility] for  "hearing voices and stuff". She goes on to say that she moved to [place] about a  Month ago in an apartment with her daughter and that about 2 weeks ago she became to feel like she was being ""set up" and describes that the father of her daughter and her cousin are trying to "set me up". I. She has difficulty describing this but states that "someone is going to do something to me". She states that it has been at least 5 months since she took abilify and states that she received abilify LAI on one occasion although did not follow up to receive it after that. When discussing patient's presentation to the [facility] yesterday, she states that "someone blew smoke into the room" during her assessment yesterday and begins to laugh.  . She reports previous psychiatric hospitalizations but is unable to state when or where these hospitalizations occurred. She denies substance use, legal history. She denies SI/HI. She reports VH of voices that instruct her to do things but denies that they instruct her to harm herself or others. She denies VH. Patient is very distracted throughout assessment and appears to have difficulty expressing her thoughts. She reports family history of mental illness-cousin and uncle with schizophrenia and grandmother with schizophrenia or bipolar. Pt states that she has not slept in several days which is in part d/t paranoia. She is amenable to admission and re-initiating medications. Discussed restarting abilify vs starting a different medication. Pt cites cost as a concern for per as she was unable to afford abilify. Pt amenable to start risperdal 1 mg BID.  Per chart review (see note from [facility]), patient presented yesterday in a similar fashion with delusional and paranoid TC of being set up.  She also reported at that time that the father of her daughter and her cousin were putting things in her food and drink. At that time patient declined to stay and was discharged. See full note from visit  for details  Patient was admitted for observation and started on risperdal 1 mg BID. The following day patient denied SI/HI/AVH. No psychotic (delusions, IOR, etc) symptoms were elicited or volunteered on interview. Objectively, patient appeared much improved, did not appear to be RIS, was less distracted, and displayed linear TP. Pt requesting discharge and  was discharged with information for open access and sample medication. See above for more details.   Total Time spent with patient: 20 minutes  Past Psychiatric History: see h&P Past Medical History:  Past Medical History:  Diagnosis Date  . Medical history non-contributory   . Schizophrenia [facility])     Past Surgical History:  Procedure Laterality Date  . ADENOIDECTOMY    . CESAREAN SECTION N/A 03/26/2016   Procedure: PRIMARY CESAREAN SECTION;  Surgeon: Jaymes Graff, MD;  [facility];  Service: Obstetrics;  Laterality: N/A;  Provider requests RNFA -ap  . TONSILLECTOMY     Family History:  Family History  Problem Relation Age of Onset  . Hypertension Maternal Grandfather   . Hypertension Paternal Grandfather   . Diabetes Mother   . Hypertension Mother   . Asthma Father   . Hypertension Father    Family Psychiatric History: see H&P Social History:  Social History   Substance and Sexual Activity  Alcohol Use Yes     Social History   Substance and Sexual Activity  Drug Use Yes  . Types: Marijuana    Social History   Socioeconomic History  . Marital status: Single    Spouse name: Not on file  . Number of children: Not on file  . Years of education: Not on file  . Highest education level: Not on file  Occupational History  . Not on file  Tobacco Use  . Smoking status: Never Smoker  . Smokeless tobacco: Never Used  Vaping Use  . Vaping Use: Never used  Substance and Sexual Activity  . Alcohol use: Yes  . Drug use: Yes    Types: Marijuana  . Sexual activity: Yes    Birth control/protection:  None  Other Topics Concern  . Not on file  Social History Narrative  . Not on file   Social Determinants of Health   Financial Resource Strain: Not on file  Food Insecurity: Not on file  Transportation Needs: Not on file  Physical Activity: Not on file  Stress: Not on file  Social Connections: Not on file   SDOH:  SDOH Screenings   Alcohol Screen: Not on file  Depression (EZM6-2): Not on file  Financial Resource Strain: Not on file  Food Insecurity: Not on file  Housing: Not on file  Physical Activity: Not on file  Social Connections: Not on file  Stress: Not on file  Tobacco Use: Low Risk   . Smoking Tobacco Use: Never Smoker  . Smokeless Tobacco Use: Never Used  Transportation Needs: Not on file    Has this patient used any form of tobacco in the last 30 days? (Cigarettes, Smokeless Tobacco, Cigars, and/or Pipes) Prescription not provided because: n/a  Current Medications:  Current Facility-Administered Medications  Medication Dose Route Frequency Provider Last Rate Last Admin  . acetaminophen (TYLENOL) tablet 650 mg  650 mg Oral Q6H PRN Estella Husk, MD      .  alum & mag hydroxide-simeth (MAALOX/MYLANTA) 200-200-20 MG/5ML suspension 30 mL  30 mL Oral Q4H PRN Estella HuskLaubach, Katherine S, MD      . hydrOXYzine (ATARAX/VISTARIL) tablet 25 mg  25 mg Oral TID PRN Estella HuskLaubach, Katherine S, MD      . magnesium hydroxide (MILK OF MAGNESIA) suspension 30 mL  30 mL Oral Daily PRN Estella HuskLaubach, Katherine S, MD      . risperiDONE (RISPERDAL) tablet 1 mg  1 mg Oral BID Estella HuskLaubach, Katherine S, MD   1 mg at 08/18/20 0959  . traZODone (DESYREL) tablet 50 mg  50 mg Oral QHS PRN Estella HuskLaubach, Katherine S, MD       Current Outpatient Medications  Medication Sig Dispense Refill  . risperiDONE (RISPERDAL) 1 MG tablet Take 1 tablet (1 mg total) by mouth 2 (two) times daily for 7 days. 14 tablet 0    PTA Medications: (Not in a hospital admission)   Musculoskeletal  Strength & Muscle Tone: within  normal limits Gait & Station: normal Patient leans: N/A  Psychiatric Specialty Exam  Presentation  General Appearance: Appropriate for Environment; Fairly Groomed  Eye Contact:Good  Speech:Clear and Coherent; Normal Rate  Speech Volume:Normal  Handedness:No data recorded  Mood and Affect  Mood:Euthymic  Affect:Appropriate; Congruent   Thought Process  Thought Processes:Coherent; Goal Directed; Linear  Descriptions of Associations:Intact  Orientation:Full (Time, Place and Person)  Thought Content:WDL  Diagnosis of Schizophrenia or Schizoaffective disorder in past: Yes  Duration of Psychotic Symptoms: Greater than six months (reports dx of schizophrenia ~1 year ago)   Hallucinations:Hallucinations: None (patient denies AVH and does not appear to objectively be RIS)   Ideas of Reference:None  Suicidal Thoughts:Suicidal Thoughts: No  Homicidal Thoughts:Homicidal Thoughts: No   Sensorium  Memory:Immediate Good; Recent Fair; Remote Fair  Judgment:Fair  Insight:Fair   Executive Functions  Concentration:Fair  Attention Span:Fair  Recall:Fair  Fund of Knowledge:Fair  Language:Good   Psychomotor Activity  Psychomotor Activity:Psychomotor Activity: Normal   Assets  Assets:Communication Skills; Vocational/Educational; Social Support   Sleep  Sleep:Sleep: Fair   Nutritional Assessment (For OBS and FBC admissions only) Has the patient had a weight loss or gain of 10 pounds or more in the last 3 months?: No Has the patient had a decrease in food intake/or appetite?: No Does the patient have eating habits or behaviors that may be indicators of an eating disorder including binging or inducing vomiting?: No Has the patient recently lost weight without trying?: No Has the patient been eating poorly because of a decreased appetite?: No Malnutrition Screening Tool Score: 0    Physical Exam  Physical Exam Constitutional:      Appearance: Normal  appearance. She is normal weight.  HENT:     Head: Normocephalic and atraumatic.  Pulmonary:     Effort: Pulmonary effort is normal.  Neurological:     Mental Status: She is alert.    Review of Systems  Constitutional: Negative for fever.  Respiratory: Negative for cough.   Cardiovascular: Negative for chest pain.  Gastrointestinal: Negative for abdominal pain.  Musculoskeletal: Negative for myalgias.  Neurological: Negative for dizziness and headaches.  Psychiatric/Behavioral: Negative for depression and suicidal ideas.   Blood pressure 115/84, pulse (!) 115, temperature 97.8 F (36.6 C), temperature source Temporal, resp. rate 20, SpO2 99 %, unknown if currently breastfeeding. There is no height or weight on file to calculate BMI.  Demographic Factors:  Adolescent or young adult  Loss Factors: Decrease in vocational status  Historical Factors: NA  Risk  Reduction Factors:   Responsible for children under 51 years of age, Sense of responsibility to family and Positive social support  Continued Clinical Symptoms:  Previous Psychiatric Diagnoses and Treatments  Cognitive Features That Contribute To Risk:  None    Suicide Risk:  Minimal: No identifiable suicidal ideation.  Patients presenting with no risk factors but with morbid ruminations; may be classified as minimal risk based on the severity of the depressive symptoms  Plan Of Care/Follow-up recommendations:  Activity:  as tolerated Diet:  regular Other:    Patient is instructed prior to discharge to: Take all medications as prescribed by his/her mental healthcare provider. Report any adverse effects and or reactions from the medicines to his/her outpatient provider promptly. Patient has been instructed & cautioned: To not engage in alcohol and or illegal drug use while on prescription medicines. In the event of worsening symptoms, patient is instructed to call the crisis hotline, 911 and or go to the nearest ED for  appropriate evaluation and treatment of symptoms. To follow-up with his/her primary care provider for your other medical issues, concerns and or health care needs.    Please come to [facility] (this facility) during walk in hours for appointment with psychiatrist for further medication management and for therapy.   Walk in hours are 8-11 AM Monday through Thursday for medication management.It is first come, first -serve; it is best to arrive by 7:00 AM. On Friday from 1 pm to 4 pm for therapy intake only. Please arrive by 12:00 pm as it is  first come, first -serve.   When you arrive please go upstairs for your appointment. If you are unsure of where to go, inform the front desk that you are here for a walk in appointment and they will assist you with directions upstairs.  Address:  [address], in [place], [address] Ph: [phone]    Disposition: home to apartment  Estella Husk, MD 08/18/2020, 3:22 PM

## 2020-08-18 NOTE — Discharge Instructions (Signed)
Please come to [facility] (this facility) during walk in hours for appointment with psychiatrist for further medication management and for therapy.   Walk in hours are 8-11 AM Monday through Thursday for medication management.It is first come, first -serve; it is best to arrive by 7:00 AM. On Friday from 1 pm to 4 pm for therapy intake only. Please arrive by 12:00 pm as it is  first come, first -serve.   When you arrive please go upstairs for your appointment. If you are unsure of where to go, inform the front desk that you are here for a walk in appointment and they will assist you with directions upstairs.  Please present for a walk in appointment before your 7 day samples run out so that you will not be without your medication.   Address:  [address] in [place], [address] Ph: [phone]

## 2020-08-19 ENCOUNTER — Telehealth (HOSPITAL_COMMUNITY): Payer: Self-pay | Admitting: General Practice

## 2020-08-19 NOTE — BH Assessment (Signed)
Care Management - Follow Up [facility] Discharges   Writer made contact with the patient.  Patient reports that she has not made an appointment with a psychiatrist or therapist.   Writer provided patient with the following resources:       Writer informed patient that she is able to come to [facility] without an appointment Mon thru Thurs from 8am to 11am.  Writer stressed that these appointments are first come first serve.    

## 2020-08-22 ENCOUNTER — Other Ambulatory Visit: Payer: Self-pay | Admitting: Psychiatry

## 2020-08-22 ENCOUNTER — Other Ambulatory Visit: Payer: Self-pay

## 2020-08-22 ENCOUNTER — Encounter (HOSPITAL_COMMUNITY): Payer: Self-pay | Admitting: Psychiatry

## 2020-08-22 ENCOUNTER — Ambulatory Visit (INDEPENDENT_AMBULATORY_CARE_PROVIDER_SITE_OTHER): Payer: No Payment, Other | Admitting: Psychiatry

## 2020-08-22 VITALS — BP 174/99 | HR 100 | Ht 63.0 in | Wt 202.0 lb

## 2020-08-22 DIAGNOSIS — F203 Undifferentiated schizophrenia: Secondary | ICD-10-CM | POA: Diagnosis not present

## 2020-08-22 MED ORDER — RISPERIDONE 1 MG PO TABS: 1.0000 mg | ORAL_TABLET | Freq: Two times a day (BID) | ORAL | 2 refills | Status: DC

## 2020-08-22 MED ORDER — TRAZODONE HCL 50 MG PO TABS: 50.0000 mg | ORAL_TABLET | Freq: Every day | ORAL | 2 refills | Status: DC

## 2020-08-22 MED FILL — traZODone HCL 50 MG TABS: 50 | 30 days supply | Qty: 30 | Fill #0

## 2020-08-22 MED FILL — risperiDONE 1 MG TABS: 1 | 30 days supply | Qty: 60 | Fill #0
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
## 2020-09-08 ENCOUNTER — Other Ambulatory Visit: Payer: Self-pay

## 2020-09-08 MED FILL — Risperidone Tab 1 MG: ORAL | 30 days supply | Qty: 60 | Fill #0 | Status: CN

## 2020-09-12 ENCOUNTER — Other Ambulatory Visit: Payer: Self-pay

## 2020-09-12 ENCOUNTER — Emergency Department (HOSPITAL_COMMUNITY)
Admission: EM | Admit: 2020-09-12 | Discharge: 2020-09-12 | Disposition: A | Discharge status: Home / Self Care | Payer: Medicaid Other | Attending: Emergency Medicine | Admitting: Emergency Medicine

## 2020-09-12 ENCOUNTER — Other Ambulatory Visit (HOSPITAL_COMMUNITY): Payer: Self-pay

## 2020-09-12 ENCOUNTER — Encounter (HOSPITAL_COMMUNITY): Payer: Self-pay

## 2020-09-12 DIAGNOSIS — R102 Pelvic and perineal pain: Secondary | ICD-10-CM | POA: Diagnosis present

## 2020-09-12 DIAGNOSIS — N899 Noninflammatory disorder of vagina, unspecified: Secondary | ICD-10-CM | POA: Insufficient documentation

## 2020-09-12 DIAGNOSIS — N898 Other specified noninflammatory disorders of vagina: Secondary | ICD-10-CM

## 2020-09-12 MED ORDER — VALACYCLOVIR HCL 1 G PO TABS: 1000.0000 mg | ORAL_TABLET | Freq: Two times a day (BID) | ORAL | 0 refills | Status: DC

## 2020-09-12 MED ORDER — VALACYCLOVIR HCL 1 G PO TABS
1000.0000 mg | ORAL_TABLET | Freq: Two times a day (BID) | ORAL | 0 refills | Status: AC
  Filled 2020-09-12: qty 20, 10d supply, fill #0

## 2020-09-12 NOTE — ED Triage Notes (Signed)
C/o vaginal itching and burning, may have blisters on labia

## 2020-09-12 NOTE — ED Notes (Signed)
ED Provider at bedside. 

## 2020-09-12 NOTE — Discharge Instructions (Signed)
As we discussed your lesions could be herpes, I would start taking the valacyclovir as we discussed.  Your MyChart will have the culture result on it in the next 48 hours.  As we discussed this may not be 100% accurate due to the lesions not being open.  If they are positive I would have your partner get tested as well.  Please follow-up with women's health, if you have any new or worsening concerning symptoms please come back to the emergency department.  Please speak to pharmacist about any new medications prescribed today in regards to side effects or other interactions with medications.  I want you to follow-up with the OB/GYN, their information is provided.

## 2020-09-12 NOTE — ED Notes (Signed)
Record is locked.  Pt has been d/ced

## 2020-09-12 NOTE — ED Provider Notes (Signed)
[facility] EMERGENCY DEPARTMENT Provider Note   CSN: [id number] Arrival date & time: 09/12/20  0841     History Chief Complaint  Patient presents with  . Vaginal Itching    Possible blisters on labia for past two days    Stacey Guzman is a 25 y.o. female w/ past medical history of schizophrenia that presents emergency department today for vaginal pain.  Patient states that she started having lesions on her vagina 2 days ago, states that they are painful, describes it as a burning sensation, worse when she urinates on it or touches it.  States that the lesions are clumped and on her labia on the left side.  Denies any recent sexual partners, states that she has having unprotected sex in a monogamous relationship.  Denies any pelvic pain, vaginal swelling, vaginal discharge or vaginal bleeding.  States that she had HPV about 6 months ago, however presented differently.  No dysuria or hematuria.    Denies any fevers, chills, nausea, vomiting, abdominal pain.  Denies any chance of STDs, does not want to be tested for gonorrhea chlamydia today.  No known sexual exposures.  Has not tried anything for this.  HPI     Past Medical History:  Diagnosis Date  . Medical history non-contributory   . Schizophrenia [facility])     Patient Active Problem List   Diagnosis Date Noted  . Undifferentiated schizophrenia (HCC) 08/22/2020  . Fever of unknown origin following delivery, postpartum 03/27/2016  . S/P C-section 03/26/2016    Past Surgical History:  Procedure Laterality Date  . ADENOIDECTOMY    . CESAREAN SECTION N/A 03/26/2016   Procedure: PRIMARY CESAREAN SECTION;  Surgeon: Jaymes Graff, MD;  Location: [facility];  Service: Obstetrics;  Laterality: N/A;  Provider requests RNFA -ap  . TONSILLECTOMY       OB History    Gravida  2   Para  1   Term  1   Preterm      AB      Living  1     SAB      IAB      Ectopic      Multiple  0   Live Births   1           Family History  Problem Relation Age of Onset  . Hypertension Maternal Grandfather   . Hypertension Paternal Grandfather   . Diabetes Mother   . Hypertension Mother   . Asthma Father   . Hypertension Father     Social History   Tobacco Use  . Smoking status: Never Smoker  . Smokeless tobacco: Never Used  Vaping Use  . Vaping Use: Never used  Substance Use Topics  . Alcohol use: Yes  . Drug use: Yes    Types: Marijuana    Home Medications Prior to Admission medications   Medication Sig Start Date End Date Taking? Authorizing Provider  risperiDONE (RISPERDAL) 1 MG tablet Take 1 tablet (1 mg total) by mouth 2 (two) times daily. 08/22/20  Yes Toy Cookey E, NP  traZODone (DESYREL) 50 MG tablet Take 1 tablet (50 mg total) by mouth at bedtime. Patient taking differently: Take 50 mg by mouth at bedtime as needed for sleep. 08/22/20  Yes Toy Cookey E, NP  valACYclovir (VALTREX) 1000 MG tablet Take 1 tablet (1,000 mg total) by mouth 2 (two) times daily for 10 days. 09/12/20 09/22/20 Yes Anjolina Byrer, PA-C  risperiDONE (RISPERDAL) 1 MG tablet TAKE 1  TABLET (1 MG TOTAL) BY MOUTH 2 (TWO) TIMES DAILY. Patient not taking: Reported on 09/12/2020 08/22/20 08/22/21  Shanna Cisco, NP  traZODone (DESYREL) 50 MG tablet TAKE 1 TABLET (50 MG TOTAL) BY MOUTH AT BEDTIME. Patient not taking: Reported on 09/12/2020 08/22/20 08/22/21  Shanna Cisco, NP    Allergies    Patient has no known allergies.  Review of Systems   Review of Systems  Constitutional: Negative for diaphoresis, fatigue and fever.  Eyes: Negative for visual disturbance.  Respiratory: Negative for shortness of breath.   Cardiovascular: Negative for chest pain.  Gastrointestinal: Negative for nausea and vomiting.  Genitourinary: Positive for genital sores and vaginal pain. Negative for dysuria, enuresis, flank pain, frequency, hematuria, menstrual problem, pelvic pain, vaginal bleeding and  vaginal discharge.  Musculoskeletal: Negative for back pain and myalgias.  Skin: Negative for color change, pallor, rash and wound.  Neurological: Negative for syncope, weakness, light-headedness, numbness and headaches.  Psychiatric/Behavioral: Negative for behavioral problems and confusion.    Physical Exam Updated Vital Signs BP 125/86   Pulse 91   Temp 98.3 F (36.8 C) (Oral)   Resp 16   Ht 5\' 3"  (1.6 m)   Wt 91.2 kg   SpO2 99%   BMI 35.61 kg/m   Physical Exam Constitutional:      General: She is not in acute distress.    Appearance: Normal appearance. She is not ill-appearing, toxic-appearing or diaphoretic.  Cardiovascular:     Rate and Rhythm: Normal rate and regular rhythm.     Pulses: Normal pulses.  Pulmonary:     Effort: Pulmonary effort is normal.     Breath sounds: Normal breath sounds.  Abdominal:     General: Abdomen is flat. There is no distension.     Palpations: There is no mass.     Tenderness: There is no abdominal tenderness. There is no guarding or rebound.     Hernia: No hernia is present.  Genitourinary:    Comments: Chaperone present.  3 vesicular lesions on right labia, are not weeping.  They are clustered together.  Somewhat tender, however not extremely tender to palpation.  Patient states that it feels like a burning sensation when I touch them. Internal vaginal exam with no lesions internally.  Cervix closed, no discharge noted.  No cervical motion or adnexal tenderness. Musculoskeletal:        General: Normal range of motion.  Skin:    General: Skin is warm and dry.     Capillary Refill: Capillary refill takes less than 2 seconds.  Neurological:     General: No focal deficit present.     Mental Status: She is alert and oriented to person, place, and time.  Psychiatric:        Mood and Affect: Mood normal.        Behavior: Behavior normal.        Thought Content: Thought content normal.     ED Results / Procedures / Treatments    Labs (all labs ordered are listed, but only abnormal results are displayed) Labs Reviewed - No data to display  EKG None  Radiology No results found.  Procedures Procedures   Medications Ordered in ED Medications - No data to display  ED Course  I have reviewed the triage vital signs and the nursing notes.  Pertinent labs & imaging results that were available during my care of the patient were reviewed by me and considered in my medical decision making (see  chart for details).    MDM Rules/Calculators/A&P                          Coti Burd is a 25 y.o. female (past medical history of schizophrenia that presents emergency department today for vaginal pain.  Lesions are presenting like herpes simplex.  We will treat for this.  Patient does not want to be STD tested for other STDs, did send a viral culture.  Symptomatic treatment discussed.  Doubt need for further emergent work up at this time. I explained the diagnosis and have given explicit precautions to return to the ER including for any other new or worsening symptoms. The patient understands and accepts the medical plan as it's been dictated and I have answered their questions. Discharge instructions concerning home care and prescriptions have been given. The patient is STABLE and is discharged to home in good condition.   Final Clinical Impression(s) / ED Diagnoses Final diagnoses:  Vaginal lesion    Rx / DC Orders ED Discharge Orders         Ordered    valACYclovir (VALTREX) 1000 MG tablet  2 times daily        09/12/20 0936           Farrel Gordon, PA-C 09/12/20 0948    Little, Ambrose Finland, MD 09/13/20 [phone]

## 2020-09-22 ENCOUNTER — Telehealth (HOSPITAL_COMMUNITY): Payer: Self-pay

## 2020-09-26 ENCOUNTER — Other Ambulatory Visit: Payer: Self-pay

## 2020-09-26 MED FILL — Risperidone Tab 1 MG: ORAL | 30 days supply | Qty: 60 | Fill #0 | Status: AC

## 2020-10-04 ENCOUNTER — Telehealth (HOSPITAL_COMMUNITY): Payer: Self-pay | Admitting: Licensed Clinical Social Worker

## 2020-10-04 NOTE — Telephone Encounter (Signed)
Pt called to cancel New Pt Therapy appt 10/05/20. Pt states she is receiving counseling services at [facility] with a school counselor.

## 2020-10-05 ENCOUNTER — Ambulatory Visit (HOSPITAL_COMMUNITY): Payer: Medicaid Other | Admitting: Licensed Clinical Social Worker

## 2020-10-27 ENCOUNTER — Other Ambulatory Visit: Payer: Self-pay

## 2020-10-27 MED FILL — Risperidone Tab 1 MG: ORAL | 30 days supply | Qty: 60 | Fill #1 | Status: AC

## 2020-10-30 IMAGING — US US OB COMP LESS 14 WK
1 series · 14 of 17 positions shown · non-contrast
Comparison: None.

CLINICAL DATA: Vaginal bleeding

EXAM:
OBSTETRIC <14 WK ULTRASOUND
TECHNIQUE: Transabdominal ultrasound was performed for evaluation of the
gestation as well as the maternal uterus and adnexal regions.

[Series 1: us ob comp less 14 wk · 17 acquisitions, 14 frames shown]
[im 1/17]
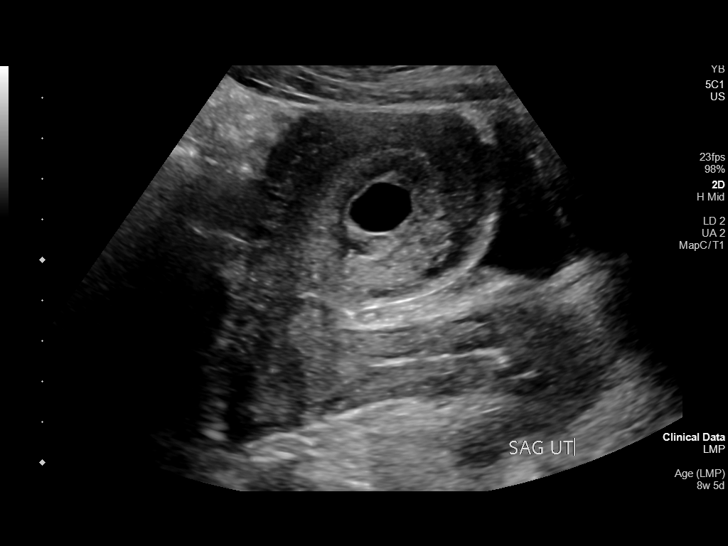
[im 2/17]
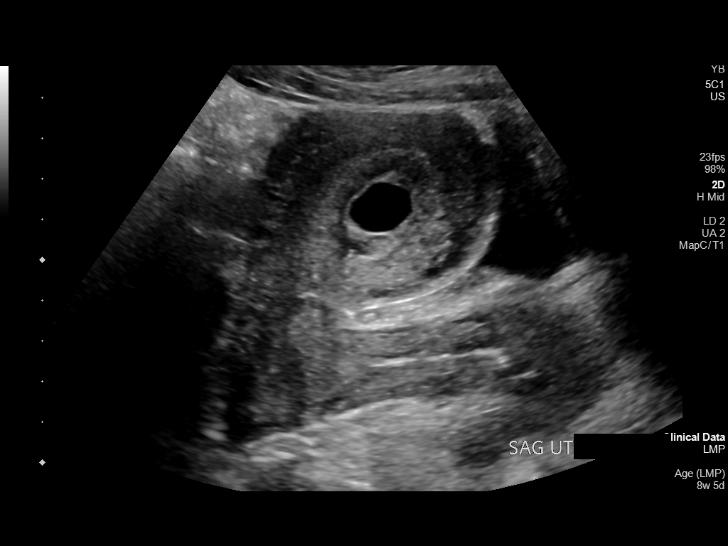
[im 4/17]
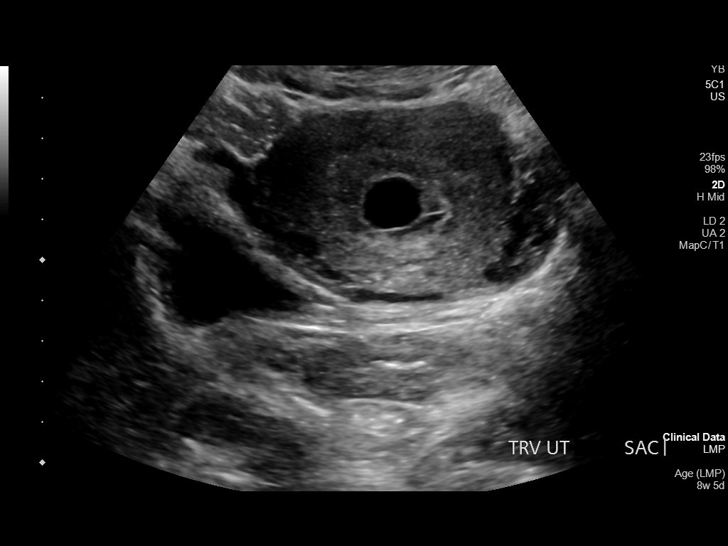
[im 5/17]
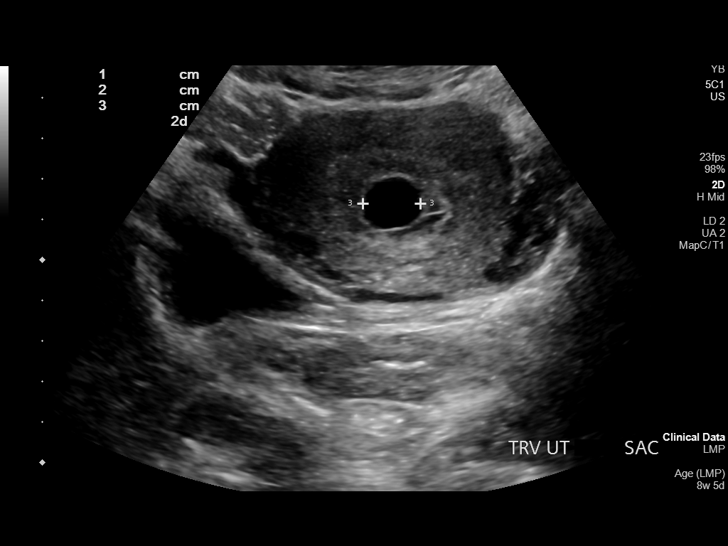
[im 6/17]
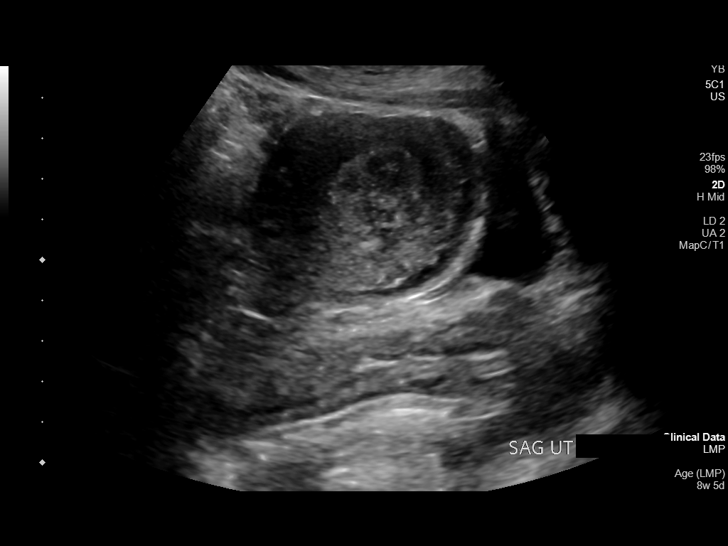
[im 7/17]
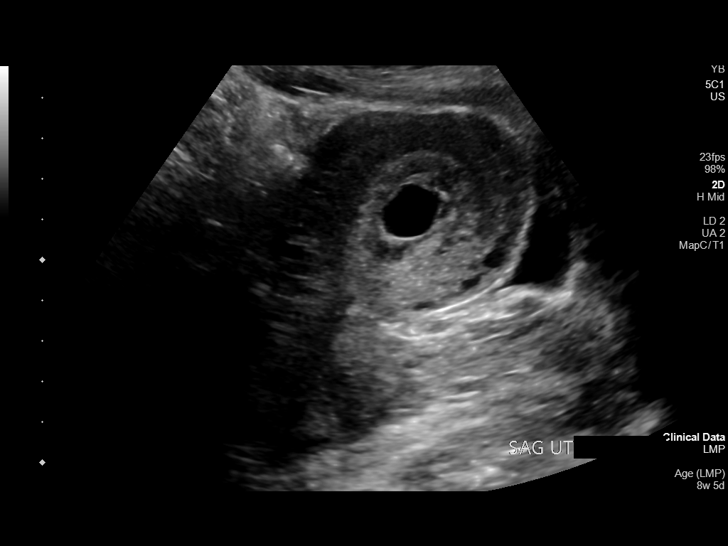
[im 8/17]
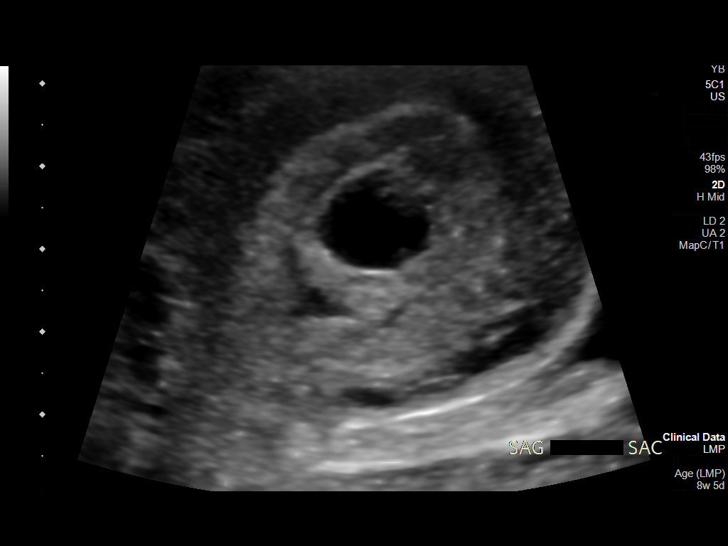
[im 10/17]
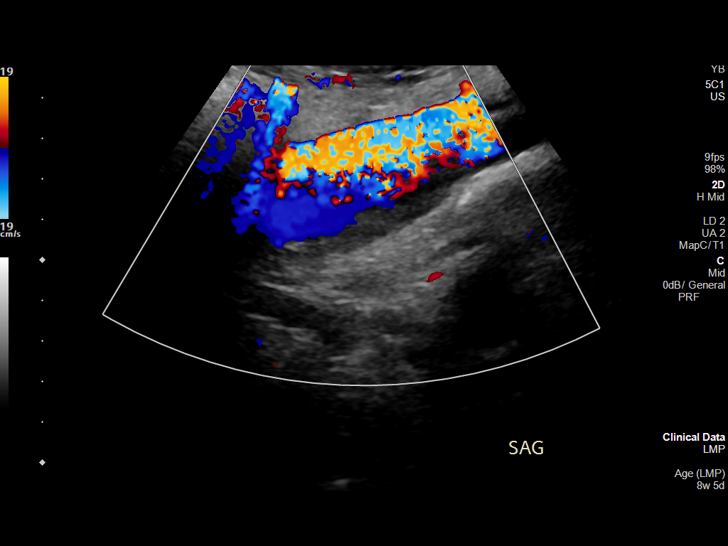
[im 11/17]
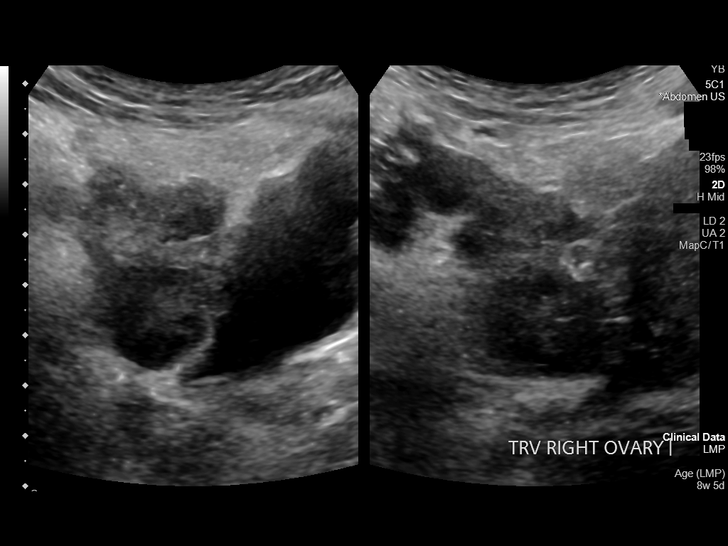
[im 12/17]
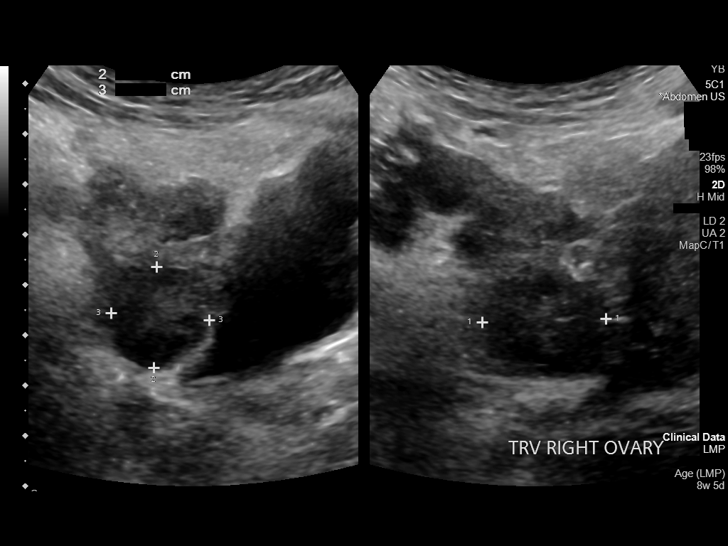
[im 13/17]
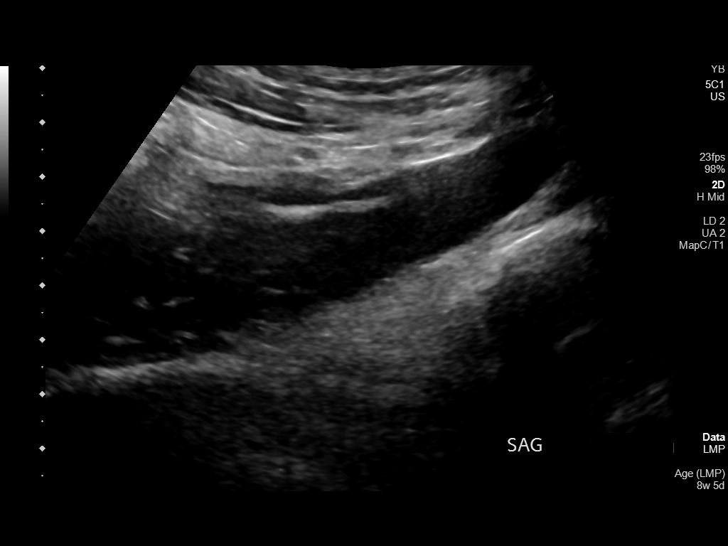
[im 14/17]
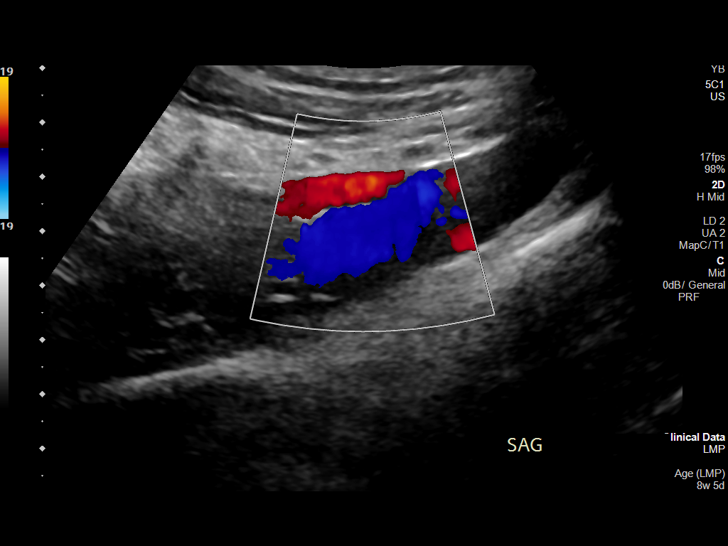
[im 16/17]
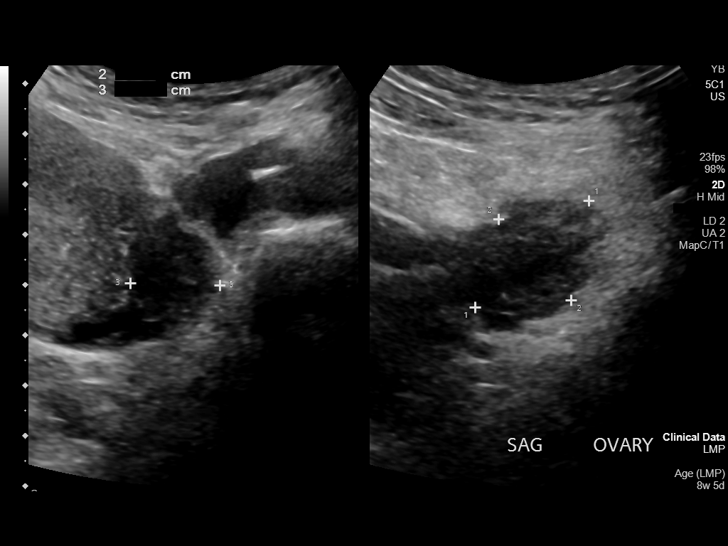
[im 17/17]
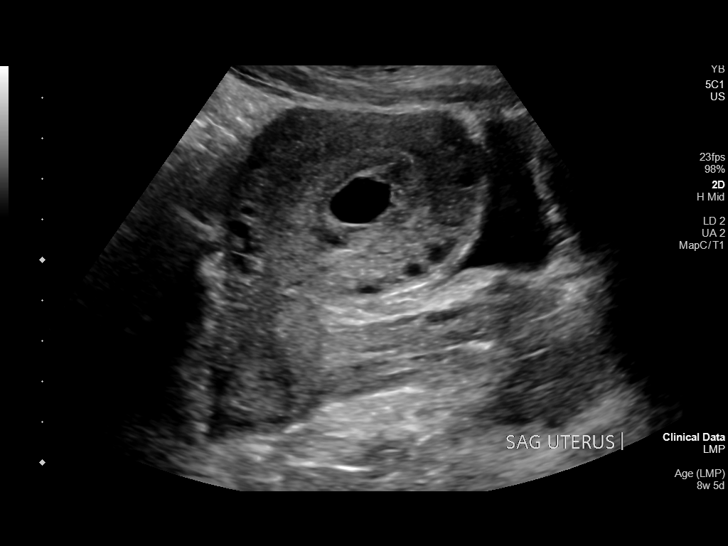

[14 of 17 positions shown; findings below may reference images not displayed]

FINDINGS: Intrauterine gestational sac: Single

Yolk sac:  Not Visualized.

Embryo:  Not Visualized.

Cardiac Activity: Not Visualized.

MSD:  14 mm   6 w   2 d

Subchorionic hemorrhage: There is a small volume of subchorionic
hemorrhage.

Maternal uterus/adnexae: There is no significant maternal
abnormality.
IMPRESSION: Single IUP at 6 weeks and 2 days.

Probable early intrauterine gestational sac, but no yolk sac, fetal
pole, or cardiac activity yet visualized. Recommend follow-up
quantitative B-HCG levels and follow-up US in 14 days to assess
viability. This recommendation follows SRU consensus guidelines:
Diagnostic Criteria for Nonviable Pregnancy Early in the First
Trimester. N Engl J Med 6239; [DATE].

## 2020-11-14 ENCOUNTER — Other Ambulatory Visit: Payer: Self-pay

## 2020-11-14 ENCOUNTER — Telehealth (INDEPENDENT_AMBULATORY_CARE_PROVIDER_SITE_OTHER): Payer: No Payment, Other | Admitting: Psychiatry

## 2020-11-14 ENCOUNTER — Encounter (HOSPITAL_COMMUNITY): Payer: Self-pay | Admitting: Psychiatry

## 2020-11-14 DIAGNOSIS — F203 Undifferentiated schizophrenia: Secondary | ICD-10-CM

## 2020-11-14 MED ORDER — RISPERIDONE 1 MG PO TABS
1.0000 mg | ORAL_TABLET | Freq: Two times a day (BID) | ORAL | 2 refills | Status: DC
  Filled 2020-11-14 – 2020-11-29 (×2): qty 60, 30d supply, fill #0
  Filled 2021-01-03: qty 60, 30d supply, fill #1

## 2020-11-14 NOTE — Progress Notes (Signed)
BH MD/PA/NP OP Progress Note Virtual Visit via Video Note  I connected with Stacey Guzman on 11/14/20 at  3:30 PM EDT by a video enabled telemedicine application and verified that I am speaking with the correct person using two identifiers.  Location: Patient: Home Provider: Clinic   I discussed the limitations of evaluation and management by telemedicine and the availability of in person appointments. The patient expressed understanding and agreed to proceed.  I provided 30 minutes of non-face-to-face time during this encounter.   11/14/2020 3:53 PM Girtie Wiersma  MRN:  [id number]  Chief Complaint: "The medication make me feel normal but I'm gaining weight"  HPI: 25 year old female seen today for follow up psychiatric evaluation.  She has a psychiatric history of schizophrenia.  She is currently managed on Risperdal 1 mg twice daily and trazodone 50 mg nightly.  She notes that her medications are effective in managing her psychiatric conditions.  She informed provider that she does not use trazodone frequently and would like to discontinue it.   Today she is well-groomed, pleasant, cooperative, engaged in conversation, and maintained eye contact.  She informed provider that she has been doing well and notes that her medications make her feel normal.  She informed provider however that she dislikes the weight gain that she has had since being on Risperdal.  She notes that she felt that Abilify was ineffective and notes that Invega did not make her feel as well as Risperdal dose.  Patient notes that she has gained almost 40 pounds since being on the Risperdal.  She has provider if there was a medication to help reduce her weight.  Provider informed patient that weight loss medications were not filled at this clinic.  She endorsed understanding and notes that she will try to eat better and exercise more.  She notes that she did some research and saw that several individuals on antipsychotics have  been able to lose weight and keep it off.  Patient notes that her anxiety and depression are very minimal.  Provider conducted a GAD-7 and patient scored a 0, at her last visit he scored a 4.  Provider also conducted a PHQ-9 and patient scored a 1, at her last visit she scored an 11.  She endorses adequate sleep and increased appetite.  She notes her mood has been stable and denies SI/HI/AVH, mania, or paranoia.  Patient notes that she continues to go to [employer] and is studying nursing.  She notes that she has maintained A's in most of her classes.  She notes that she is considering dropping biology and business.  She notes that these classes are mentally stressful and inform her that she does not want to become mentally unstable.  She notes however that she fears that if she drops her course that she will be penalized financially.  She reports that provider write a letter notifying her school that these 2 courses are mentally taxing.  Provider was agreeable to this request.  Today she is agreeable to discontinuing trazodone 50 mg.  She will continue all other medications as prescribed.  She will follow up with outpatient counseling for therapy.  No other concerns noted at this time. 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 risperiDONE (RISPERDAL) 1 MG tablet      Past Psychiatric History: history of schizophrenia  Past Medical History:  Past Medical History:  Diagnosis Date   Medical history non-contributory    Schizophrenia (HCC)  Past Surgical History:  Procedure Laterality Date   ADENOIDECTOMY     CESAREAN SECTION N/A 03/26/2016   Procedure: PRIMARY CESAREAN SECTION;  Surgeon: Jaymes Graff, MD;  Location: [facility];  Service: Obstetrics;  Laterality: N/A;  Provider requests RNFA -ap   TONSILLECTOMY      Family Psychiatric History:  Maternal grandmother substance use, maternal cousin and uncle with schizophrenia and grandmother with  schizophrenia or bipolar  Family History:  Family History  Problem Relation Age of Onset   Hypertension Maternal Grandfather    Hypertension Paternal Grandfather    Diabetes Mother    Hypertension Mother    Asthma Father    Hypertension Father     Social History:  Social History   Socioeconomic History   Marital status: Single    Spouse name: Not on file   Number of children: Not on file   Years of education: Not on file   Highest education level: Not on file  Occupational History   Not on file  Tobacco Use   Smoking status: Never   Smokeless tobacco: Never  Vaping Use   Vaping Use: Never used  Substance and Sexual Activity   Alcohol use: Yes   Drug use: Yes    Types: Marijuana   Sexual activity: Yes    Birth control/protection: None  Other Topics Concern   Not on file  Social History Narrative   Not on file   Social Determinants of Health   Financial Resource Strain: Not on file  Food Insecurity: Not on file  Transportation Needs: Not on file  Physical Activity: Not on file  Stress: Not on file  Social Connections: Not on file    Allergies: No Known Allergies  Metabolic Disorder Labs: Lab Results  Component Value Date   HGBA1C 5.7 (H) 08/17/2020   MPG 116.89 08/17/2020   No results found for: PROLACTIN Lab Results  Component Value Date   CHOL 122 08/17/2020   TRIG 30 08/17/2020   HDL 56 08/17/2020   CHOLHDL 2.2 08/17/2020   VLDL 6 08/17/2020   LDLCALC 60 08/17/2020   Lab Results  Component Value Date   TSH 2.874 08/17/2020    Therapeutic Level Labs: No results found for: LITHIUM No results found for: VALPROATE No components found for:  CBMZ  Current Medications: Current Outpatient Medications  Medication Sig Dispense Refill   risperiDONE (RISPERDAL) 1 MG tablet Take 1 tablet (1 mg total) by mouth 2 (two) times daily. 60 tablet 2   No current facility-administered medications for this visit.     Musculoskeletal: Strength & Muscle  Tone:  Unable to assess due to telehealth visit Gait & Station:  Unable to assess due to telehealth visit Patient leans: N/A  Psychiatric Specialty Exam: Review of Systems  unknown if currently breastfeeding.There is no height or weight on file to calculate BMI.  General Appearance: Well Groomed  Eye Contact:  Good  Speech:  Clear and Coherent and Normal Rate  Volume:  Normal  Mood:  Euthymic  Affect:  Appropriate and Congruent  Thought Process:  Coherent, Goal Directed, and Linear  Orientation:  Full (Time, Place, and Person)  Thought Content: WDL and Logical   Suicidal Thoughts:  No  Homicidal Thoughts:  No  Memory:  Immediate;   Good Recent;   Good Remote;   Good  Judgement:  Good  Insight:  Good  Psychomotor Activity:  Normal  Concentration:  Concentration: Good and Attention Span: Good  Recall:  Good  Fund of Knowledge: Good  Language: Good  Akathisia:  No  Handed:  Right  AIMS (if indicated): not done  Assets:  Communication Skills Desire for Improvement Financial Resources/Insurance Housing Leisure Time Physical Health Social Support Talents/Skills Vocational/Educational  ADL's:  Intact  Cognition: WNL  Sleep:  Good   Screenings: GAD-7    Flowsheet Row Video Visit from 11/14/2020 in [facility] Clinical Support from 08/22/2020 in [facility]  Total GAD-7 Score 0 4      PHQ2-9    Flowsheet Row Video Visit from 11/14/2020 in [facility] Clinical Support from 08/22/2020 in [facility]  PHQ-2 Total Score 0 3  PHQ-9 Total Score 1 11      Flowsheet Row ED from 09/12/2020 in [facility] EMERGENCY DEPARTMENT Clinical Support from 08/22/2020 in [facility] ED from 08/17/2020 in [facility]  C-SSRS RISK CATEGORY No Risk No Risk No Risk        Assessment and Plan: Patient  notes that she is doing well on her current medication regimen.  He informed her that at this time she does not need trazodone and would like to discontinue it today.  She will continue all other medications as prescribed.  1. Undifferentiated schizophrenia (HCC)  Continue- risperiDONE (RISPERDAL) 1 MG tablet; Take 1 tablet (1 mg total) by mouth 2 (two) times daily.  Dispense: 60 tablet; Refill: 2  Follow-up in 3 Follow-up with therapy   Shanna Cisco, NP 11/14/2020, 3:53 PM

## 2020-11-29 ENCOUNTER — Other Ambulatory Visit: Payer: Self-pay

## 2020-11-30 ENCOUNTER — Other Ambulatory Visit: Payer: Self-pay

## 2021-01-03 ENCOUNTER — Other Ambulatory Visit: Payer: Self-pay

## 2021-01-05 ENCOUNTER — Ambulatory Visit (INDEPENDENT_AMBULATORY_CARE_PROVIDER_SITE_OTHER): Payer: No Payment, Other | Admitting: Psychiatry

## 2021-01-05 ENCOUNTER — Encounter (HOSPITAL_COMMUNITY): Payer: Self-pay | Admitting: Psychiatry

## 2021-01-05 ENCOUNTER — Other Ambulatory Visit: Payer: Self-pay

## 2021-01-05 ENCOUNTER — Other Ambulatory Visit (HOSPITAL_COMMUNITY): Payer: Self-pay | Admitting: *Deleted

## 2021-01-05 VITALS — BP 114/71 | HR 91 | Ht 63.0 in | Wt 201.0 lb

## 2021-01-05 DIAGNOSIS — F203 Undifferentiated schizophrenia: Secondary | ICD-10-CM

## 2021-01-05 DIAGNOSIS — N643 Galactorrhea not associated with childbirth: Secondary | ICD-10-CM

## 2021-01-05 MED ORDER — RISPERIDONE 0.5 MG PO TABS
0.5000 mg | ORAL_TABLET | Freq: Two times a day (BID) | ORAL | 2 refills | Status: DC
  Filled 2021-01-05 – 2021-01-06 (×2): qty 60, 30d supply, fill #0

## 2021-01-05 NOTE — Progress Notes (Addendum)
BH MD/PA/NP OP Progress Note    01/05/2021 9:04 AM Stacey Guzman  MRN:  [id number]  Chief Complaint: " I am restless at night, have missed periods, leaky breast, vaginal dryness, and chest pains" Chief Complaint   Medication Management     HPI: 25 year old female seen today for follow up psychiatric evaluation.  She has a psychiatric history of schizophrenia.  She is currently managed on Risperdal 1 mg twice daily.  She notes that her medications are effective in managing her psychiatric conditions and reports she has been having several side effects.  Today she is well-groomed, pleasant, cooperative, engaged in conversation, and maintained eye contact.  She informed provider that he is mentally stable however has been having several side effects from Risperdal.  She notes that her body becomes restless at night, she has missed several periods, her breast has been leaking milk, she has vaginal dryness, and chest pain.  She notes that she is concerned about her health.  She informed Clinical research associate that her uncle also had schizophrenia and died of cardiac disease.  Provider was empathetic towards patient concerns and informed her that Risperdal could be because of these side effects.  Prolactin levels, BMP, hCG, and hepatic function drawn today.  Patient referred to community health and wellness for primary care evaluation.  Patient notes that the above worsens her anxiety and depression however notes that they are still minimal.  Provider conducted a PHQ-9 and patient scored a 5, at last visit she scored a 1.  Provider also conducted a GAD-7 and patient scored a 3, at her last visit she scored a 0.  She notes that she sleeps approximately 6 to 10 hours nightly.  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  Today she denies SI/HI/VAH, mania, or paranoia.   Patient notes that she continues to take her prerequisites at [employer]  She notes that she plans to apply at [employer] [facility] to further nursing  program.  Patient notes that she found Abilify, and Invega ineffective in the past.  Today she notes she wants to continue Risperdal.  Provider recommended reducing Risperdal 1 mg twice daily to 0.5 mg twice daily.  She endorsed understanding and agreed.  After labs are evaluated and assessed Risperdal may be continued were switched for another antipsychotic such as Captlyta (less weight gain, and prolactinoma), Zyprexa, Latuda, Seroquel, Loxapine.  She will follow up with outpatient counseling for therapy.  No other concerns noted at this time. 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       Past Psychiatric History: history of schizophrenia  Past Medical History:  Past Medical History:  Diagnosis Date   Medical history non-contributory    Schizophrenia (HCC)     Past Surgical History:  Procedure Laterality Date   ADENOIDECTOMY     CESAREAN SECTION N/A 03/26/2016   Procedure: PRIMARY CESAREAN SECTION;  Surgeon: Jaymes Graff, MD;  [facility];  Service: Obstetrics;  Laterality: N/A;  Provider requests RNFA -ap   TONSILLECTOMY      Family Psychiatric History:  Maternal grandmother substance use, maternal cousin and uncle with schizophrenia and grandmother with schizophrenia or bipolar  Family History:  Family History  Problem Relation Age of Onset   Hypertension Maternal Grandfather    Hypertension Paternal Grandfather    Diabetes Mother    Hypertension Mother    Asthma Father    Hypertension Father     Social History:  Social History  Socioeconomic History   Marital status: Single    Spouse name: Not on file   Number of children: Not on file   Years of education: Not on file   Highest education level: Not on file  Occupational History   Not on file  Tobacco Use   Smoking status: Never   Smokeless tobacco: Never  Vaping Use   Vaping Use: Never used  Substance and Sexual Activity   Alcohol use: Yes   Drug  use: Yes    Types: Marijuana   Sexual activity: Yes    Birth control/protection: None  Other Topics Concern   Not on file  Social History Narrative   Not on file   Social Determinants of Health   Financial Resource Strain: Not on file  Food Insecurity: Not on file  Transportation Needs: Not on file  Physical Activity: Not on file  Stress: Not on file  Social Connections: Not on file    Allergies: No Known Allergies  Metabolic Disorder Labs: Lab Results  Component Value Date   HGBA1C 5.7 (H) 08/17/2020   MPG 116.89 08/17/2020   No results found for: PROLACTIN Lab Results  Component Value Date   CHOL 122 08/17/2020   TRIG 30 08/17/2020   HDL 56 08/17/2020   CHOLHDL 2.2 08/17/2020   VLDL 6 08/17/2020   LDLCALC 60 08/17/2020   Lab Results  Component Value Date   TSH 2.874 08/17/2020    Therapeutic Level Labs: No results found for: LITHIUM No results found for: VALPROATE No components found for:  CBMZ  Current Medications: Current Outpatient Medications  Medication Sig Dispense Refill   risperiDONE (RISPERDAL) 1 MG tablet Take 1 tablet (1 mg total) by mouth 2 (two) times daily. 60 tablet 2   No current facility-administered medications for this visit.     Musculoskeletal: Strength & Muscle Tone:  Unable to assess due to telehealth visit Gait & Station:  Unable to assess due to telehealth visit Patient leans: N/A  Psychiatric Specialty Exam: Review of Systems  Blood pressure 114/71, pulse 91, height 5\' 3"  (1.6 m), weight 201 lb (91.2 kg), SpO2 100 %, unknown if currently breastfeeding.Body mass index is 35.61 kg/m.  General Appearance: Well Groomed  Eye Contact:  Good  Speech:  Clear and Coherent and Normal Rate  Volume:  Normal  Mood:  Euthymic  Affect:  Appropriate and Congruent  Thought Process:  Coherent, Goal Directed, and Linear  Orientation:  Full (Time, Place, and Person)  Thought Content: WDL and Logical   Suicidal Thoughts:  No  Homicidal  Thoughts:  No  Memory:  Immediate;   Good Recent;   Good Remote;   Good  Judgement:  Good  Insight:  Good  Psychomotor Activity:  Normal  Concentration:  Concentration: Good and Attention Span: Good  Recall:  Good  Fund of Knowledge: Good  Language: Good  Akathisia:  No  Handed:  Right  AIMS (if indicated): not done  Assets:  Communication Skills Desire for Improvement Financial Resources/Insurance Housing Leisure Time Physical Health Social Support Talents/Skills Vocational/Educational  ADL's:  Intact  Cognition: WNL  Sleep:  Good   Screenings: GAD-7    Flowsheet Row Video Visit from 11/14/2020 in [facility] Clinical Support from 08/22/2020 in [facility]  Total GAD-7 Score 0 4      PHQ2-9    Flowsheet Row Video Visit from 11/14/2020 in [facility] Clinical Support from 08/22/2020 in [facility]  PHQ-2 Total Score 0 3  PHQ-9 Total Score 1 11      Flowsheet Row ED from 09/12/2020 in [facility] EMERGENCY DEPART[facility] Clinical Support from 08/22/2020 in [facility] ED from 08/17/2020 in [facility]  C-SSRS RISK CATEGORY No Risk No Risk No Risk        Assessment and Plan: Patient said her mood has been stable on Risperdal however informed writer of several side effects.  At this time provider recommended reducing Risperdal 1 mg twice daily to 0.5 mg twice daily.  A BMP, LFT, hCG, and prolactin levels were drawn today.  Provider referred patient to community health and wellness center for physical examination.  1. Undifferentiated schizophrenia (HCC)  - Basic Metabolic Panel (BMET); Future - Hepatic function panel; Future Reduce- risperiDONE (RISPERDAL) 0.5 MG tablet; Take 1 tablet (0.5 mg total) by mouth 2 (two) times daily.  Dispense: 60 tablet; Refill: 2 - Ambulatory referral to  Internal Medicine  2. Galactorrhea  - Prolactin - hCG, quantitative, pregnancy; Future - Basic Metabolic Panel (BMET); Future - Hepatic function panel; Future - Ambulatory referral to Internal Medicine   Follow-up in 3 Follow-up with therapy   Shanna Cisco, NP 01/05/2021, 9:04 AM

## 2021-01-05 NOTE — Progress Notes (Signed)
Patient arrived  with concerns & provider ordered labs. Labs drawn in Right hand

## 2021-01-06 ENCOUNTER — Other Ambulatory Visit: Payer: Self-pay

## 2021-01-06 ENCOUNTER — Telehealth (HOSPITAL_COMMUNITY): Payer: Self-pay | Admitting: *Deleted

## 2021-01-06 NOTE — Telephone Encounter (Signed)
Followed up for results from yesterdays lab draw. They did not receive a request for the HCG test, it was added today, she is to fax over the results once the HCG results with the other three that were drawn.

## 2021-01-09 LAB — PROLACTIN: Prolactin: 337 ng/mL — ABNORMAL HIGH (ref 4.8–23.3)

## 2021-01-09 LAB — BASIC METABOLIC PANEL
BUN/Creatinine Ratio: 11 (ref 9–23)
BUN: 10 mg/dL (ref 6–20)
CO2: 22 mmol/L (ref 20–29)
Calcium: 9.3 mg/dL (ref 8.7–10.2)
Chloride: 101 mmol/L (ref 96–106)
Creatinine, Ser: 0.91 mg/dL (ref 0.57–1.00)
Glucose: 93 mg/dL (ref 65–99)
Potassium: 3.9 mmol/L (ref 3.5–5.2)
Sodium: 136 mmol/L (ref 134–144)
eGFR: 90 mL/min/{1.73_m2} (ref >59)

## 2021-01-09 LAB — HEPATIC FUNCTION PANEL
ALT: 11 IU/L (ref 0–32)
AST: 19 IU/L (ref 0–40)
Albumin: 4.5 g/dL (ref 3.9–5.0)
Alkaline Phosphatase: 75 IU/L (ref 44–121)
Bilirubin Total: 0.5 mg/dL (ref 0.0–1.2)
Bilirubin, Direct: 0.15 mg/dL (ref 0.00–0.40)
Total Protein: 7.2 g/dL (ref 6.0–8.5)

## 2021-01-10 ENCOUNTER — Other Ambulatory Visit: Payer: Self-pay

## 2021-01-10 ENCOUNTER — Other Ambulatory Visit (HOSPITAL_COMMUNITY): Payer: Self-pay | Admitting: Psychiatry

## 2021-01-10 MED ORDER — CAPLYTA 42 MG PO CAPS
42.0000 mg | ORAL_CAPSULE | Freq: Every day | ORAL | 2 refills | Status: DC
  Filled 2021-01-10: qty 30, 30d supply, fill #0

## 2021-01-10 NOTE — Progress Notes (Signed)
Provider spoke to patient about results.  Patient notes that she is concerned about her increased prolactin level.  Patient's current prolactin level is 337.0.  Normal range for prolactin is 4.8 to 23.3.  Today patient is agreeable to the reducing Risperdal 0.5 mg twice daily change 0.5 mg once daily for a week.  She will then discontinue the remaining 0.5 mg and start Caplyta 42 mg daily to help manage symptoms of schizophrenia. Potential side effects of medication and risks vs benefits of treatment vs non-treatment were explained and discussed. All questions were answered.  No other concerns noted at this time.

## 2021-01-11 ENCOUNTER — Other Ambulatory Visit: Payer: Self-pay

## 2021-01-12 LAB — BETA HCG QUANT (REF LAB): hCG Quant: <1 m[IU]/mL

## 2021-01-12 LAB — SPECIMEN STATUS REPORT

## 2021-01-17 ENCOUNTER — Other Ambulatory Visit (HOSPITAL_COMMUNITY): Payer: Self-pay | Admitting: Psychiatry

## 2021-01-17 ENCOUNTER — Other Ambulatory Visit: Payer: Self-pay

## 2021-01-17 ENCOUNTER — Telehealth (HOSPITAL_COMMUNITY): Payer: Self-pay | Admitting: *Deleted

## 2021-01-17 ENCOUNTER — Telehealth (HOSPITAL_COMMUNITY): Payer: Self-pay | Admitting: Psychiatry

## 2021-01-17 NOTE — Telephone Encounter (Signed)
Patient called to report concerns about taking CAPLYTA due to risk of seizures.  Pt states she has heard of another medication she would like to discuss trying. Please call pt [phone] to advise.

## 2021-01-17 NOTE — Telephone Encounter (Signed)
Patient notes that she is fearful of starting Caplyta.  She notes that she has a family history of seizures however denies having a history of seizures herself.  She notes that she researched that it can cause seizures.  Provider informed patient that Caplyta is used cautiously in patients with a history of seizures because it reduces the seizure threshold.  She notes that she still feels unsafe and prefers to take another antibiotic psychotic.  She notes that her uncle found success on Seroquel and reports that she would like to give that medication a try. Potential side effects of medication and risks vs benefits of treatment vs non-treatment were explained and discussed. All questions were answered.  Seroquel 50 mg filled and sent to preferred pharmacy.  Patient notes she continues to take 1 Risperdal tablet.  Provider informed her to discontinue Risperdal prior to taking Seroquel.  She endorsed understanding and agreed.  Patient returns for follow-up visit on 02/14/2021.  At that time a prolactin level will be redrawn as well as other necessary labs.  No other concerns noted at this time

## 2021-01-17 NOTE — Telephone Encounter (Signed)
Opened a second time in error. 

## 2021-01-18 ENCOUNTER — Other Ambulatory Visit: Payer: Self-pay

## 2021-01-27 ENCOUNTER — Other Ambulatory Visit: Payer: Self-pay

## 2021-01-27 ENCOUNTER — Telehealth (HOSPITAL_COMMUNITY): Payer: Self-pay | Admitting: *Deleted

## 2021-01-27 ENCOUNTER — Other Ambulatory Visit (HOSPITAL_COMMUNITY): Payer: Self-pay | Admitting: Psychiatry

## 2021-01-27 DIAGNOSIS — F203 Undifferentiated schizophrenia: Secondary | ICD-10-CM

## 2021-01-27 MED ORDER — QUETIAPINE FUMARATE 50 MG PO TABS
50.0000 mg | ORAL_TABLET | Freq: Every day | ORAL | 3 refills | Status: DC
Start: 2021-01-27 — End: 2021-07-20
  Filled 2021-01-27 – 2021-02-03 (×2): qty 30, 30d supply, fill #0

## 2021-01-27 NOTE — Telephone Encounter (Signed)
Seroquel sent to [facility]. Patient made aware.

## 2021-01-27 NOTE — Telephone Encounter (Signed)
Call from patient, she never got medicine called into [facility] and [facility]. From your last phone call contact looks like Seroquel was being considered because patient concerned about taking Caplyta. Will forward this concern to her provider.

## 2021-02-03 ENCOUNTER — Other Ambulatory Visit: Payer: Self-pay

## 2021-02-14 ENCOUNTER — Encounter (HOSPITAL_COMMUNITY): Payer: No Payment, Other | Admitting: Psychiatry

## 2021-04-11 ENCOUNTER — Encounter (HOSPITAL_COMMUNITY): Payer: No Payment, Other | Admitting: Psychiatry

## 2021-07-18 ENCOUNTER — Other Ambulatory Visit: Payer: Self-pay

## 2021-07-19 ENCOUNTER — Telehealth (HOSPITAL_COMMUNITY): Payer: Self-pay | Admitting: Psychiatry

## 2021-07-19 NOTE — Telephone Encounter (Signed)
Patient presented to complete a walk-in with provider. Writer informed patient that her provider has walk-in on Monday and Tuesday only. Patient stated that she was not in crisis, but wanted to speak with her provider about medications. Patient has not completed a visit with provider since 12/2020, and has cancelled/no show other appts.

## 2021-07-20 ENCOUNTER — Ambulatory Visit (HOSPITAL_COMMUNITY)
Admission: EM | Admit: 2021-07-20 | Discharge: 2021-07-21 | Disposition: A | Discharge status: Home / Self Care | Payer: No Payment, Other | Attending: Family | Admitting: Family

## 2021-07-20 DIAGNOSIS — F22 Delusional disorders: Secondary | ICD-10-CM | POA: Diagnosis not present

## 2021-07-20 DIAGNOSIS — G4700 Insomnia, unspecified: Secondary | ICD-10-CM | POA: Insufficient documentation

## 2021-07-20 DIAGNOSIS — Z20822 Contact with and (suspected) exposure to covid-19: Secondary | ICD-10-CM | POA: Diagnosis not present

## 2021-07-20 DIAGNOSIS — F203 Undifferentiated schizophrenia: Secondary | ICD-10-CM | POA: Insufficient documentation

## 2021-07-20 DIAGNOSIS — Z79899 Other long term (current) drug therapy: Secondary | ICD-10-CM | POA: Insufficient documentation

## 2021-07-20 LAB — CBC WITH DIFFERENTIAL/PLATELET
Abs Immature Granulocytes: 0.03 10*3/uL (ref 0.00–0.07)
Basophils Absolute: 0 10*3/uL (ref 0.0–0.1)
Basophils Relative: 1 %
Eosinophils Absolute: 0 10*3/uL (ref 0.0–0.5)
Eosinophils Relative: 0 %
HCT: 36.4 % (ref 36.0–46.0)
Hemoglobin: 11.9 g/dL — ABNORMAL LOW (ref 12.0–15.0)
Immature Granulocytes: 0 %
Lymphocytes Relative: 25 %
Lymphs Abs: 1.9 10*3/uL (ref 0.7–4.0)
MCH: 27.2 pg (ref 26.0–34.0)
MCHC: 32.7 g/dL (ref 30.0–36.0)
MCV: 83.1 fL (ref 80.0–100.0)
Monocytes Absolute: 0.6 10*3/uL (ref 0.1–1.0)
Monocytes Relative: 8 %
Neutro Abs: 4.9 10*3/uL (ref 1.7–7.7)
Neutrophils Relative %: 66 %
Platelets: 316 10*3/uL (ref 150–400)
RBC: 4.38 MIL/uL (ref 3.87–5.11)
RDW: 14.5 % (ref 11.5–15.5)
WBC: 7.4 10*3/uL (ref 4.0–10.5)
nRBC: 0 % (ref 0.0–0.2)

## 2021-07-20 LAB — PREGNANCY, URINE: Preg Test, Ur: NEGATIVE

## 2021-07-20 LAB — COMPREHENSIVE METABOLIC PANEL
ALT: 15 U/L (ref 0–44)
AST: 14 U/L — ABNORMAL LOW (ref 15–41)
Albumin: 4 g/dL (ref 3.5–5.0)
Alkaline Phosphatase: 60 U/L (ref 38–126)
Anion gap: 9 (ref 5–15)
BUN: 9 mg/dL (ref 6–20)
CO2: 26 mmol/L (ref 22–32)
Calcium: 9.1 mg/dL (ref 8.9–10.3)
Chloride: 104 mmol/L (ref 98–111)
Creatinine, Ser: 1.12 mg/dL — ABNORMAL HIGH (ref 0.44–1.00)
GFR, Estimated: >60 mL/min (ref >60)
Glucose, Bld: 91 mg/dL (ref 70–99)
Potassium: 3.5 mmol/L (ref 3.5–5.1)
Sodium: 139 mmol/L (ref 135–145)
Total Bilirubin: 0.7 mg/dL (ref 0.3–1.2)
Total Protein: 7.6 g/dL (ref 6.5–8.1)

## 2021-07-20 LAB — MAGNESIUM: Magnesium: 2.1 mg/dL (ref 1.7–2.4)

## 2021-07-20 LAB — RESP PANEL BY RT-PCR (FLU A&B, COVID) ARPGX2
Influenza A by PCR: NEGATIVE
Influenza B by PCR: NEGATIVE
SARS Coronavirus 2 by RT PCR: NEGATIVE

## 2021-07-20 LAB — POCT URINE DRUG SCREEN - MANUAL ENTRY (I-SCREEN)
POC Amphetamine UR: NOT DETECTED
POC Buprenorphine (BUP): NOT DETECTED
POC Cocaine UR: NOT DETECTED
POC Marijuana UR: NOT DETECTED
POC Methadone UR: NOT DETECTED
POC Methamphetamine UR: NOT DETECTED
POC Morphine: NOT DETECTED
POC Oxazepam (BZO): NOT DETECTED
POC Oxycodone UR: NOT DETECTED
POC Secobarbital (BAR): NOT DETECTED

## 2021-07-20 LAB — ETHANOL: Alcohol, Ethyl (B): <10 mg/dL (ref <10)

## 2021-07-20 LAB — LIPID PANEL
Cholesterol: 113 mg/dL (ref 0–200)
HDL: 43 mg/dL (ref >40)
LDL Cholesterol: 65 mg/dL (ref 0–99)
Total CHOL/HDL Ratio: 2.6 RATIO
Triglycerides: 23 mg/dL (ref <150)
VLDL: 5 mg/dL (ref 0–40)

## 2021-07-20 LAB — TSH: TSH: 1.522 u[IU]/mL (ref 0.350–4.500)

## 2021-07-20 LAB — POC SARS CORONAVIRUS 2 AG -  ED: SARS Coronavirus 2 Ag: NEGATIVE

## 2021-07-20 MED ORDER — ALUM & MAG HYDROXIDE-SIMETH 200-200-20 MG/5ML PO SUSP: 30.0000 mL | ORAL | Status: DC | PRN

## 2021-07-20 MED ORDER — ACETAMINOPHEN 325 MG PO TABS: 650.0000 mg | ORAL_TABLET | Freq: Four times a day (QID) | ORAL | Status: DC | PRN

## 2021-07-20 MED ORDER — MAGNESIUM HYDROXIDE 400 MG/5ML PO SUSP: 30.0000 mL | Freq: Every day | ORAL | Status: DC | PRN

## 2021-07-20 MED ORDER — OLANZAPINE 5 MG PO TBDP
5.0000 mg | ORAL_TABLET | Freq: Every day | ORAL | Status: DC
  Administered 2021-07-20: 5 mg via ORAL
  Filled 2021-07-20: qty 1

## 2021-07-20 MED ORDER — HYDROXYZINE HCL 25 MG PO TABS: 25.0000 mg | ORAL_TABLET | Freq: Three times a day (TID) | ORAL | Status: DC | PRN

## 2021-07-20 MED ORDER — TRAZODONE HCL 50 MG PO TABS
50.0000 mg | ORAL_TABLET | Freq: Every evening | ORAL | Status: DC | PRN
  Administered 2021-07-20: 50 mg via ORAL
  Filled 2021-07-20: qty 1

## 2021-07-20 NOTE — Telephone Encounter (Signed)
Provider attempted to call patient twice without success.  Provider left a message for patient to schedule an appointment to walk into the clinic at her convenience.  No other concerns at this time.

## 2021-07-20 NOTE — ED Provider Notes (Signed)
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   Stacey Guzman reports she has been sleeping very little for approximately 1 week.  She reports sleeping approximately 3 hours per night.  Additionally she endorses auditory hallucinations.  She reports she hears the voice of her child's father, states "I hear the faint voice, it is like whispers."  She also feels that her home may be inhabited by a demon.  She reports 2 nights ago she slept on the couch of her aunt's home and last night she and her 26-year-old daughter slept at a local hotel related to feeling that her home is inhabited by a Ambulance persondemon.  Recent stressors include an argument with her child's father that resulted in him moving out of the family home 1 week ago.  Stacey Guzman has been diagnosed with schizophrenia.  She has been most recently [facility]  She was stable on Risperdal for some time however this caused elevated prolactin and leaking breasts, risperidone discontinued at that time, patient was prescribed Seroquel in August 2022.  She reports this medication "made me feel foggy and have a headache."  She stopped this medication and has not had medications to address her schizophrenia in approximately 6 months.  She has been followed by [facility], last saw them several months ago.  She reports plan is to follow-up with both medication management and [facility] moving forward.  Patient is assessed face-to-face by nurse practitioner.  She is seated in assessment area, no acute distress.  She is alert and  oriented, pleasant and cooperative during assessment.  She presents with euthymic mood, congruent affect. She denies suicidal and homicidal ideations. She denies history of suicide attempts, denies history of  non suicidal self-harm behavior.    She has normal speech and behavior.  She denies  visual hallucinations, denies command hallucinations.  Patient is able to converse coherently with goal-directed thoughts.   [place] in [place] with her 26-year-old daughter, she denies access to weapons.  She is employed in [employer].  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  She denies alcohol and substance use.  Patient offered support and encouragement. Patient's 863-year-old daughter is currently with her (daughter's) father.  Discussed plan to initiate olanzapine 5 mg daily to address mood.  Discussed side effects and patient given opportunity to ask questions.  Patient agrees with plan to initiate olanzapine.    PHQ 2-9:  Flowsheet Row [facility] Clinical [facility]  Thoughts that you would be better off dead, or of hurting yourself in some way Not at all Not at all  PHQ-9 Total Score 1 11       Flowsheet Row ED from [facility]2021 [facility] from [facility]12/2020 [facility] Clinical Support from 08/22/2020 [facility]  C-SSRS RISK CATEGORY No Risk No Risk No Risk        Total Time spent with patient: 9330  minutes  Musculoskeletal  Strength & Muscle Tone: within normal limits Gait & Station: normal Patient leans: N/A  Psychiatric Specialty Exam  Presentation General Appearance: Appropriate for Environment; Casual  Eye Contact:Good  Speech:Clear and Coherent; Normal Rate  Speech  Volume:Normal  Handedness:Right   Mood and Affect  Mood:Euthymic  Affect:Appropriate; Congruent   Thought Process  Thought Processes:Coherent; Goal Directed; Linear  Descriptions of Associations:Intact  Orientation:Full (Time, Place and Person)  Thought Content:Paranoid Ideation  Diagnosis of Schizophrenia or Schizoaffective disorder in past: Yes  Duration of Psychotic Symptoms: Greater than six months  Hallucinations:Hallucinations: Auditory Description of Auditory Hallucinations: "I hear the voice of my baby's father, soft voices"  Ideas of Reference:Paranoia  Suicidal Thoughts:Suicidal Thoughts: No  Homicidal Thoughts:Homicidal Thoughts: No   Sensorium  Memory:Immediate Good; Recent Good  Judgment:Fair  Insight:Fair   Executive Functions  Concentration:Good  Attention Span:Good  Recall:Good  Fund of Knowledge:Good  Language:Good   Psychomotor Activity  Psychomotor Activity:Psychomotor Activity: Normal   Assets  Assets:Communication Skills; Desire for Improvement; Housing; Intimacy; Leisure Time; Physical Health; Resilience; Social Support   Sleep  Sleep:Sleep: Poor Number of Hours of Sleep: 3   Nutritional Assessment (For OBS and FBC admissions only) Has the patient had a weight loss or gain of 10 pounds or more in the last 3 months?: No Has the patient had a decrease in food intake/or appetite?: Yes Does the patient have dental problems?: No Does the patient have eating habits or behaviors that may be indicators of an eating disorder including binging or inducing vomiting?: No Has the patient recently lost weight without trying?: 2.0 Has the patient been eating poorly because of a decreased appetite?: 1 Malnutrition Screening Tool Score: 3    Physical Exam Vitals and nursing note reviewed.  Constitutional:      Appearance: Normal appearance. She is well-developed.  HENT:     Head: Normocephalic and atraumatic.     Nose: Nose  normal.  Cardiovascular:     Rate and Rhythm: Normal rate.  Pulmonary:     Effort: Pulmonary effort is normal.  Musculoskeletal:        General: Normal range of motion.     Cervical back: Normal range of motion.  Neurological:     Mental Status: She is alert and oriented to person, place, and time.  Psychiatric:        Attention and Perception: Attention normal. She perceives auditory hallucinations.        Mood and Affect: Mood and affect normal.        Speech: Speech normal.        Behavior: Behavior normal. Behavior is cooperative.        Thought Content: Thought content is paranoid.        Cognition and Memory: Cognition and memory normal.        Judgment: Judgment normal.   Review of Systems  Constitutional: Negative.   HENT: Negative.    Eyes: Negative.   Respiratory: Negative.    Cardiovascular: Negative.   Gastrointestinal: Negative.   Genitourinary: Negative.   Musculoskeletal: Negative.   Skin: Negative.   Neurological: Negative.   Endo/Heme/Allergies: Negative.   Psychiatric/Behavioral:  Positive for hallucinations. The patient has insomnia.    Blood pressure (!) 127/91, pulse 92, temperature 98.5 F (36.9 C), temperature source Oral, resp. rate 18, SpO2 96 %, unknown if currently breastfeeding. There is no height or weight on file to calculate BMI.  Past Psychiatric History: undifferentiated schizophrenia  Is the patient at  risk to self? No  Has the patient been a risk to self in the past 6 months? No .    Has the patient been a risk to self within the distant past? No   Is the patient a risk to others? No   Has the patient been a risk to others in the past 6 months? No   Has the patient been a risk to others within the distant past? No   Past Medical History:  Past Medical History:  Diagnosis Date   Medical history non-contributory    Schizophrenia (HCC)     Past Surgical History:  Procedure Laterality Date   ADENOIDECTOMY     CESAREAN SECTION N/A  03/26/2016   Procedure: PRIMARY CESAREAN SECTION;  Surgeon: Jaymes Graff, MD;  [facility];  Service: Obstetrics;  Laterality: N/A;  Provider requests RNFA -ap   TONSILLECTOMY      Family History:  Family History  Problem Relation Age of Onset   Hypertension Maternal Grandfather    Hypertension Paternal Grandfather    Diabetes Mother    Hypertension Mother    Asthma Father    Hypertension Father     Social History:  Social History   Socioeconomic History   Marital status: Single    Spouse name: Not on file   Number of children: Not on file   Years of education: Not on file   Highest education level: Not on file  Occupational History   Not on file  Tobacco Use   Smoking status: Never   Smokeless tobacco: Never  Vaping Use   Vaping Use: Never used  Substance and Sexual Activity   Alcohol use: Yes   Drug use: Yes    Types: Marijuana   Sexual activity: Yes    Birth control/protection: None  Other Topics Concern   Not on file  Social History Narrative   Not on file   Social Determinants of Health   Financial Resource Strain: Not on file  Food Insecurity: Not on file  Transportation Needs: Not on file  Physical Activity: Not on file  Stress: Not on file  Social Connections: Not on file  Intimate Partner Violence: Not on file    SDOH:  SDOH Screenings   Alcohol Screen: Not on file  Depression (PHQ2-9): Low Risk    PHQ-2 Score: 1  Financial Resource Strain: Not on file  Food Insecurity: Not on file  Housing: Not on file  Physical Activity: Not on file  Social Connections: Not on file  Stress: Not on file  Tobacco Use: Low Risk    Smoking Tobacco Use: Never   Smokeless Tobacco Use: Never   Passive Exposure: Not on file  Transportation Needs: Not on file    Last Labs:  Admission on 07/20/2021  Component Date Value Ref Range Status   WBC 07/20/2021 7.4  4.0 - 10.5 K/uL Final   RBC 07/20/2021 4.38  3.87 - 5.11 MIL/uL Final    Hemoglobin 07/20/2021 11.9 (L)  12.0 - 15.0 g/dL Final   HCT 84/13/2440 36.4  36.0 - 46.0 % Final   MCV 07/20/2021 83.1  80.0 - 100.0 fL Final   MCH 07/20/2021 27.2  26.0 - 34.0 pg Final   MCHC 07/20/2021 32.7  30.0 - 36.0 g/dL Final   RDW 03/24/2535 14.5  11.5 - 15.5 % Final   Platelets 07/20/2021 316  150 - 400 K/uL Final   nRBC 07/20/2021 0.0  0.0 - 0.2 % Final   Neutrophils  Relative % 07/20/2021 66  % Final   Neutro Abs 07/20/2021 4.9  1.7 - 7.7 K/uL Final   Lymphocytes Relative 07/20/2021 25  % Final   Lymphs Abs 07/20/2021 1.9  0.7 - 4.0 K/uL Final   Monocytes Relative 07/20/2021 8  % Final   Monocytes Absolute 07/20/2021 0.6  0.1 - 1.0 K/uL Final   Eosinophils Relative 07/20/2021 0  % Final   Eosinophils Absolute 07/20/2021 0.0  0.0 - 0.5 K/uL Final   Basophils Relative 07/20/2021 1  % Final   Basophils Absolute 07/20/2021 0.0  0.0 - 0.1 K/uL Final   Immature Granulocytes 07/20/2021 0  % Final   Abs Immature Granulocytes 07/20/2021 0.03  0.00 - 0.07 K/uL Final   Performed at [facility], [address] [place], [place] [address]    Allergies: Patient has no known allergies.  PTA Medications: (Not in a hospital admission)   Medical Decision Making  Patient reviewed with Dr Nelly Rout.  QT/QTc mildly elongated, measures 366/462. Patient will be placed in observation area at [facility] health for treatment and stabilization.  She will be reassessed on 07/17/2021, disposition will be determined at that time.  Patient remains voluntary.  Laboratory studies ordered including CBC, CMP, ethanol, A1c, hepatic function, lipid panel, magnesium and TSH.  Urine pregnancy and urine drug screen ordered.  EKG order initiated.  Current medications: -Acetaminophen 650 mg every 6 as needed/mild pain -Maalox 30 mL oral every 4 as needed/digestion -Hydroxyzine 25 mg 3 times daily as needed/anxiety -Magnesium hydroxide 30 mL daily as needed/mild  constipation -Trazodone 50 mg nightly as needed/sleep -Olanzapine 5 mg nightly/mood     Recommendations  Based on my evaluation the patient does not appear to have an emergency medical condition.  Lenard Lance, FNP 07/20/21  10:42 AM

## 2021-07-20 NOTE — BH Assessment (Signed)
Comprehensive Clinical Assessment (CCA) Note  07/20/2021 Ragina Gordillo [id number]  Disposition: Per Beatriz Stallion, NP, patient is recommended for overnight observation.   Verlon Fiallos is a 26 year old female presenting to [facility] voluntarily with chief complaint of not sleeping and AH. Patient reports she is getting about 3 hours of sleep at night, and she is also having AH. Initially patient reports hearing the voice of her aunt, father of her child and other people. Patient goes on to share that she was  hearing a demon voice talking loudly to her when she was at home, but she is unable to make out what the demon is saying. Patient shared with NP that she took her 50-year-old child to stay with a family member house and hotel to evade the demon, but patient continued to hear the demon. Patient reports her family suggested she come to be assessed and restart her medications.   Patient reports diagnosis of schizophrenia and reports that she has not taken medications in about 10 months. Patient was seeing a provider at [facility] however stopped treatment because she was feeling better. Per notes patient presented to [facility] yesterday to establish care again but was not able to see a provider. Patient reports history of inpatient treatment in March 2022. Patient denies substance use, legal issues, and access to a firearm. Patient lives at home with her child and the father of the child. 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   Patient oriented x4, engaged, alert and cooperative during assessment. Patient presents calm and in no distress. Patient is pleasant with responsive facial expressions. Patient thoughts are linear, and affect is appropriate. Patient denies SI, HI VH, and SIB but endorses AH. Patient does not appear to be responding to internal stimuli currently.   Chief Complaint:  Chief Complaint  Patient presents with   Insomnia   Auditory Hallucinations     Visit Diagnosis: Undifferentiated schizophrenia ([place])    CCA Screening, Triage and Referral (STR)  Patient Reported Information How did you hear about [place]? Self  What Is the Reason for Your Visit/Call Today? Pt reports she is unable to sleep, having AH and has been off medications for the past 10-11 months  How Long Has This Been Causing You Problems? 1-6 months  What Do You Feel Would Help You the Most Today? Treatment for Depression or other mood problem   Have You Recently Had Any Thoughts About Hurting Yourself? No  Are You Planning to Commit Suicide/Harm Yourself At This time? No   Have you Recently Had Thoughts About [place]? No  Are You Planning to Harm Someone at This Time? No  Explanation: No data recorded  Have You Used Any Alcohol or Drugs in the Past 24 Hours? No  How Long Ago Did You Use Drugs or Alcohol? No data recorded What Did You Use and How Much? No data recorded  Do You Currently Have a Therapist/Psychiatrist? No  Name of Therapist/Psychiatrist: No data recorded  Have You Been Recently Discharged From Any Office Practice or Programs? No  Explanation of Discharge From Practice/Program: No data recorded    CCA Screening Triage Referral Assessment Type of Contact: Face-to-Face  Telemedicine Service Delivery:   Is this Initial or Reassessment? No data recorded Date Telepsych consult ordered in CHL:  No data recorded Time Telepsych consult ordered in CHL:  No data recorded Location of Assessment: [facility] Assessment Services  Provider Location: [facility] Assessment  Services   Collateral Involvement: none   Does Patient Have a Stage manager Guardian? No data recorded Name and Contact of Legal Guardian: No data recorded If Minor and Not Living with Parent(s), Who has Custody? No data recorded Is CPS involved or ever been involved? Never  Is APS involved or ever been involved? Never   Patient Determined To Be At Risk for  Harm To Self or Others Based on Review of Patient Reported Information or Presenting Complaint? No  Method: No data recorded Availability of Means: No data recorded Intent: No data recorded Notification Required: No data recorded Additional Information for Danger to Others Potential: No data recorded Additional Comments for Danger to Others Potential: No data recorded Are There Guns or Other Weapons in Your Home? No data recorded Types of Guns/Weapons: No data recorded Are These Weapons Safely Secured?                            No data recorded Who Could Verify You Are Able To Have These Secured: No data recorded Do You Have any Outstanding Charges, Pending Court Dates, Parole/Probation? No data recorded Contacted To Inform of Risk of Harm To Self or Others: No data recorded   Does Patient Present under Involuntary Commitment? No  IVC Papers Initial File Date: No data recorded  [place] of Residence: [place]   Patient Currently Receiving the Following Services: Not Receiving Services   Determination of Need: Urgent (48 hours)   Options For Referral: Medication Management; Outpatient Therapy; Other: Comment     CCA Biopsychosocial Patient Reported Schizophrenia/Schizoaffective Diagnosis in Past: Yes   Strengths: self-awareness   Mental Health Symptoms Depression:   None   Duration of Depressive symptoms:    Mania:   None   Anxiety:    Restlessness; Sleep; Worrying; Tension   Psychosis:   Delusions; Hallucinations   Duration of Psychotic symptoms:  Duration of Psychotic Symptoms: Greater than six months   Trauma:   None   Obsessions:   None   Compulsions:   None   Inattention:   None   Hyperactivity/Impulsivity:   N/A   Oppositional/Defiant Behaviors:   None   Emotional Irregularity:   None   Other Mood/Personality Symptoms:  No data recorded   Mental Status Exam Appearance and self-care  Stature:   Average   Weight:   Average  weight   Clothing:   Neat/clean   Grooming:   Normal   Cosmetic use:   None   Posture/gait:   Normal   Motor activity:   Not Remarkable   Sensorium  Attention:   Normal   Concentration:   Anxiety interferes   Orientation:   X5   Recall/memory:   Normal   Affect and Mood  Affect:   Appropriate   Mood:   Anxious   Relating  Eye contact:   Normal   Facial expression:   Sad; Responsive   Attitude toward examiner:   Cooperative   Thought and Language  Speech flow:  Clear and Coherent   Thought content:   Appropriate to Mood and Circumstances   Preoccupation:   None   Hallucinations:   Auditory   Organization:  No data recorded  [employer] of Knowledge:   Average   Intelligence:   Average   Abstraction:   Normal   Judgement:   Fair   Reality Testing:  No data recorded  Insight:   Fair  Decision Making:   Normal   Social Functioning  Social Maturity:   Responsible   Social Judgement:   Normal   Stress  Stressors:   Family conflict; Transitions; Financial   Coping Ability:   Overwhelmed   Skill Deficits:   Self-control   Supports:   Family     Religion: Religion/Spirituality Are You A Religious Person?:  Special educational needs teacher)  Leisure/Recreation: Leisure / Recreation Do You Have Hobbies?: Yes  Exercise/Diet: Exercise/Diet Do You Exercise?: No Do You Have Any Trouble Sleeping?: Yes Explanation of Sleeping Difficulties: 3 hrs a night   CCA Employment/Education Employment/Work Situation: Employment / Work Situation Employment Situation: Employed Patient's Job has Been Impacted by Current Illness: Yes Describe how Patient's Job has Been Impacted: missed work Has Patient ever Been in Passenger transport manager?: No  Education: Education Is Patient Currently Attending School?: No Last Grade Completed: 56 Did You Nutritional therapist?: Yes Did You Have An Individualized Education Program (IIEP): No Did You Have Any  Difficulty At [employer]?: No   CCA Family/Childhood History Family and Relationship History: Family history Marital status: Single Does patient have children?: Yes How many children?: 1 How is patient's relationship with their children?: 35 year old daughter  Childhood History:  Childhood History By whom was/is the patient raised?: Both parents Did patient suffer any verbal/emotional/physical/sexual abuse as a child?: No Did patient suffer from severe childhood neglect?: No Has patient ever been sexually abused/assaulted/raped as an adolescent or adult?: No Was the patient ever a victim of a crime or a disaster?: No Witnessed domestic violence?: No Has patient been affected by domestic violence as an adult?: No  Child/Adolescent Assessment:     CCA Substance Use Alcohol/Drug Use: Alcohol / Drug Use Pain Medications: see MAR Prescriptions: see MAR Over the Counter: see MAR History of alcohol / drug use?: No history of alcohol / drug abuse                         ASAM's:  Six Dimensions of Multidimensional Assessment  Dimension 1:  Acute Intoxication and/or Withdrawal Potential:      Dimension 2:  Biomedical Conditions and Complications:      Dimension 3:  Emotional, Behavioral, or Cognitive Conditions and Complications:     Dimension 4:  Readiness to Change:     Dimension 5:  Relapse, Continued use, or Continued Problem Potential:     Dimension 6:  Recovery/Living Environment:     ASAM Severity Score:    ASAM Recommended Level of Treatment:     Substance use Disorder (SUD)    Recommendations for Services/Supports/Treatments:    Discharge Disposition:    DSM5 Diagnoses: Patient Active Problem List   Diagnosis Date Noted   Undifferentiated schizophrenia ([place]) 08/22/2020   Fever of unknown origin following delivery, postpartum 03/27/2016   S/P C-section 03/26/2016     Referrals to Alternative Service(s): Referred to Alternative Service(s):   Place:    Date:   Time:    Referred to Alternative Service(s):   Place:   Date:   Time:    Referred to Alternative Service(s):   Place:   Date:   Time:    Referred to Alternative Service(s):   Place:   Date:   Time:     Luther Redo, [facility]

## 2021-07-20 NOTE — BH Assessment (Signed)
Pt reports she has not been able to sleep well (3hrs a night) and she is also hearing voices (non-command) of her baby daddy, aunt and other people. Pt does not share what the voices are saying. Pt denies SI,HI, VH and drug use. Pt reports dx of schizophrenia and has been off her meds for the past 10-11 months. Patient reports feeling paranoid. Pt interested in getting back on her medications.  Pt is routine.

## 2021-07-21 ENCOUNTER — Other Ambulatory Visit: Payer: Self-pay

## 2021-07-21 LAB — HEMOGLOBIN A1C
Hgb A1c MFr Bld: 5.4 % (ref 4.8–5.6)
Mean Plasma Glucose: 108 mg/dL

## 2021-07-21 LAB — PROLACTIN: Prolactin: 11.4 ng/mL (ref 4.8–23.3)

## 2021-07-21 MED ORDER — OLANZAPINE 5 MG PO TBDP
5.0000 mg | ORAL_TABLET | Freq: Every day | ORAL | 0 refills | Status: DC
  Filled 2021-07-21 – 2021-07-31 (×3): qty 30, 30d supply, fill #0

## 2021-07-21 MED ORDER — TRAZODONE HCL 50 MG PO TABS
50.0000 mg | ORAL_TABLET | Freq: Every evening | ORAL | 0 refills | Status: DC | PRN
  Filled 2021-07-21 – 2021-09-11 (×4): qty 30, 30d supply, fill #0

## 2021-07-21 MED ORDER — TRAZODONE HCL 50 MG PO TABS: 50.0000 mg | ORAL_TABLET | Freq: Every evening | ORAL | 0 refills | Status: DC | PRN

## 2021-07-21 MED ORDER — OLANZAPINE 5 MG PO TBDP: 5.0000 mg | ORAL_TABLET | Freq: Every day | ORAL | 0 refills | Status: DC

## 2021-07-21 NOTE — ED Notes (Signed)
Pt reports sleeping well over the night.  Denies pain, SI, HI, and AVH at this time.  When asked if she was hearing any more of her baby's father's voice pt stated no.  Breakfast provided.  Breathing is even and unlabored.  Will continue to monitor for safety.

## 2021-07-21 NOTE — ED Notes (Signed)
Pt discharged with  AVS.  AVS reviewed prior to discharge.  Pt alert, oriented, and ambulatory.  Safety maintained.  °

## 2021-07-21 NOTE — ED Notes (Addendum)
Patient was not hungry.

## 2021-07-21 NOTE — ED Notes (Addendum)
Patient had lunch

## 2021-07-21 NOTE — ED Notes (Signed)
Patient refused to eat lunch.

## 2021-07-21 NOTE — Discharge Instructions (Addendum)

## 2021-07-21 NOTE — ED Notes (Signed)
Pt is currently sleeping, no distress noted, environmental check complete, will continue to monitor patient for safety. ? ?

## 2021-07-21 NOTE — ED Provider Notes (Addendum)
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  She is reclined in observation area upon my approach, appears asleep.  She is easily awakened. Patient is insightful regarding symptoms leading to her admission.  She reports she was not sleeping well for several days.  She reports improved sleep and appetite while at [facility] behavioral health. Today she denies auditory and visual hallucinations.  She denies paranoia.  She reports she plans to return home to her apartment with her child's father.  There is no evidence of delusional thought content and she denies symptoms of paranoia.  There is no indication that patient is responding to internal stimuli. She continues to deny suicidal or homicidal ideation.  She easily contracts verbally for safety with this Clinical research associate. Patient reports plan to follow-up with outpatient psychiatry for medication management and counseling at [facility] behavioral health outpatient. Patient offered support and encouragement.  She gives verbal consent to speak with her aunt, Stacey Guzman, [phone].  Patient's aunt is concerned  that patient's child's father may be a trigger for patient.  Patient and aunt are educated and verbalize understanding of mental health resources and other crisis services in the community. She is instructed to call 911 and present to the nearest emergency room should she experience any suicidal/homicidal ideation, auditory/visual/hallucinations, or detrimental worsening of her mental health condition.    Stay Summary: HPI from 08/17/2021 at 0849am: Patient presents voluntarily to [facility] behavioral health for walk-in assessment.   Stacey Guzman reports she  has been sleeping very little for approximately 1 week.  She reports sleeping approximately 3 hours per night.   Additionally she endorses auditory hallucinations.  She reports she hears the voice of her child's father, states "I hear the faint voice, it is like whispers."  She also feels that her home may be inhabited by a demon.  She reports 2 nights ago she slept on the couch of her aunt's home and last night she and her 58-year-old daughter slept at a local hotel related to feeling that her home is inhabited by a Ambulance person.   Recent stressors include an argument with her child's father that resulted in him moving out of the family home 1 week ago.   Stacey Guzman has been diagnosed with schizophrenia.  She has been most recently followed by outpatient psychiatry at [facility] behavioral health.  She was stable on Risperdal for some time however this caused elevated prolactin and leaking breasts, risperidone discontinued at that time, patient was prescribed Seroquel in August 2022.  She reports this medication "made me feel foggy and have a headache."  She stopped this medication and has not had medications to address her schizophrenia in approximately 6 months.  She has been followed by outpatient counseling at [facility], last saw them several months ago.  She reports plan is to follow-up with both medication management and counseling at [facility] behavioral health moving forward.   Patient is assessed face-to-face by nurse practitioner.  She is seated in assessment area, no acute distress.  She is alert and oriented, pleasant and cooperative during assessment.  She presents with euthymic mood, congruent affect. She denies suicidal and homicidal ideations. She denies history of suicide attempts, denies history of  non  suicidal self-harm behavior.     She has normal speech and behavior.  She denies  visual hallucinations, denies command hallucinations.  Patient is able to converse coherently with  goal-directed thoughts.    Patient resides in [place] with her 26-year-old daughter, she denies access to weapons.  She is employed in [employer].  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  She denies alcohol and substance use.   Patient offered support and encouragement. Patient's 43-year-old daughter is currently with her (daughter's) father.  Discussed plan to initiate olanzapine 5 mg daily to address mood.  Discussed side effects and patient given opportunity to ask questions.  Patient agrees with plan to initiate olanzapine.   Total Time spent with patient: 30 minutes  Past Psychiatric History: schizophrenia Past Medical History:  Past Medical History:  Diagnosis Date   Medical history non-contributory    Schizophrenia (HCC)     Past Surgical History:  Procedure Laterality Date   ADENOIDECTOMY     CESAREAN SECTION N/A 03/26/2016   Procedure: PRIMARY CESAREAN SECTION;  Surgeon: Jaymes Graff, MD;  Location: [facility];  Service: Obstetrics;  Laterality: N/A;  Provider requests RNFA -ap   TONSILLECTOMY     Family History:  Family History  Problem Relation Age of Onset   Hypertension Maternal Grandfather    Hypertension Paternal Grandfather    Diabetes Mother    Hypertension Mother    Asthma Father    Hypertension Father    Family Psychiatric History: none reported Social History:  Social History   Substance and Sexual Activity  Alcohol Use Yes     Social History   Substance and Sexual Activity  Drug Use Yes   Types: Marijuana    Social History   Socioeconomic History   Marital status: Single    Spouse name: Not on file   Number of children: Not on file   Years of education: Not on file   Highest education level: Not on file  Occupational History   Not on file  Tobacco Use   Smoking status: Never   Smokeless tobacco: Never  Vaping Use   Vaping Use: Never used  Substance  and Sexual Activity   Alcohol use: Yes   Drug use: Yes    Types: Marijuana   Sexual activity: Yes    Birth control/protection: None  Other Topics Concern   Not on file  Social History Narrative   Not on file   Social Determinants of Health   Financial Resource Strain: Not on file  Food Insecurity: Not on file  Transportation Needs: Not on file  Physical Activity: Not on file  Stress: Not on file  Social Connections: Not on file   SDOH:  SDOH Screenings   Alcohol Screen: Not on file  Depression (PHQ2-9): Low Risk    PHQ-2 Score: 1  Financial Resource Strain: Not on file  Food Insecurity: Not on file  Housing: Not on file  Physical Activity: Not on file  Social Connections: Not on file  Stress: Not on file  Tobacco Use: Low Risk    Smoking Tobacco Use: Never   Smokeless Tobacco Use: Never   Passive Exposure: Not on file  Transportation Needs: Not on file    Tobacco Cessation:  N/A, patient does not currently use tobacco products  Current Medications:  Current Facility-Administered Medications  Medication Dose Route Frequency Provider Last Rate Last Admin   acetaminophen (TYLENOL) tablet 650  mg  650 mg Oral Q6H PRN Lenard LanceAllen, Nelly Scriven L, FNP       alum & mag hydroxide-simeth (MAALOX/MYLANTA) 200-200-20 MG/5ML suspension 30 mL  30 mL Oral Q4H PRN Lenard LanceAllen, Deisi Salonga L, FNP       hydrOXYzine (ATARAX) tablet 25 mg  25 mg Oral TID PRN Lenard LanceAllen, Jeriyah Granlund L, FNP       magnesium hydroxide (MILK OF MAGNESIA) suspension 30 mL  30 mL Oral Daily PRN Lenard LanceAllen, Cheyenna Pankowski L, FNP       OLANZapine zydis (ZYPREXA) disintegrating tablet 5 mg  5 mg Oral QHS Lenard LanceAllen, Kenyata Guess L, FNP   5 mg at 07/20/21 2145   traZODone (DESYREL) tablet 50 mg  50 mg Oral QHS PRN Lenard LanceAllen, Sheily Lineman L, FNP   50 mg at 07/20/21 2143   Current Outpatient Medications  Medication Sig Dispense Refill   BIOTIN PO Take 1 tablet by mouth daily.     Multiple Vitamin (MULTIVITAMIN WITH MINERALS) TABS tablet Take 1 tablet by mouth daily.     VITAMIN D PO Take  1 tablet by mouth daily.      PTA Medications: (Not in a hospital admission)   Musculoskeletal  Strength & Muscle Tone: within normal limits Gait & Station: normal Patient leans: N/A  Psychiatric Specialty Exam  Presentation  General Appearance: Appropriate for Environment; Casual  Eye Contact:Good  Speech:Clear and Coherent; Normal Rate  Speech Volume:Normal  Handedness:Right   Mood and Affect  Mood:Euthymic  Affect:Appropriate; Congruent   Thought Process  Thought Processes:Coherent; Goal Directed; Linear  Descriptions of Associations:Intact  Orientation:Full (Time, Place and Person)  Thought Content:Logical; WDL  Diagnosis of Schizophrenia or Schizoaffective disorder in past: Yes    Hallucinations:Hallucinations: None Description of Auditory Hallucinations: "I hear the voice of my baby's father, soft voices"  Ideas of Reference:None  Suicidal Thoughts:Suicidal Thoughts: No  Homicidal Thoughts:Homicidal Thoughts: No   Sensorium  Memory:Immediate Good; Recent Good  Judgment:Good  Insight:Fair   Executive Functions  Concentration:Good  Attention Span:Good  Recall:Good  Fund of Knowledge:Good  Language:Good   Psychomotor Activity  Psychomotor Activity:Psychomotor Activity: Normal   Assets  Assets:Communication Skills; Desire for Improvement; Financial Resources/Insurance; Housing; Intimacy; Leisure Time; Physical Health; Resilience; Social Support   Sleep  Sleep:Sleep: Good Number of Hours of Sleep: 3   Nutritional Assessment (For OBS and FBC admissions only) Has the patient had a weight loss or gain of 10 pounds or more in the last 3 months?: No Has the patient had a decrease in food intake/or appetite?: Yes Does the patient have dental problems?: No Does the patient have eating habits or behaviors that may be indicators of an eating disorder including binging or inducing vomiting?: No Has the patient recently lost weight without  trying?: 2.0 Has the patient been eating poorly because of a decreased appetite?: 1 Malnutrition Screening Tool Score: 3    Physical Exam  Physical Exam Vitals and nursing note reviewed.  Constitutional:      Appearance: Normal appearance. She is well-developed.  HENT:     Head: Normocephalic and atraumatic.     Nose: Nose normal.  Cardiovascular:     Rate and Rhythm: Normal rate.  Pulmonary:     Effort: Pulmonary effort is normal.  Musculoskeletal:        General: Normal range of motion.     Cervical back: Normal range of motion.  Skin:    General: Skin is warm and dry.  Neurological:     Mental Status: She is alert and oriented  to person, place, and time.  Psychiatric:        Attention and Perception: Attention and perception normal.        Mood and Affect: Mood and affect normal.        Speech: Speech normal.        Behavior: Behavior normal. Behavior is cooperative.        Thought Content: Thought content normal.        Cognition and Memory: Cognition and memory normal.   Review of Systems  Constitutional: Negative.   HENT: Negative.    Eyes: Negative.   Respiratory: Negative.    Cardiovascular: Negative.   Gastrointestinal: Negative.   Genitourinary: Negative.   Musculoskeletal: Negative.   Skin: Negative.   Neurological: Negative.   Endo/Heme/Allergies: Negative.   Psychiatric/Behavioral: Negative.    Blood pressure 101/83, pulse 99, temperature 97.7 F (36.5 C), temperature source Oral, resp. rate 16, SpO2 100 %, unknown if currently breastfeeding. There is no height or weight on file to calculate BMI.  Demographic Factors:  Adolescent or young adult  Loss Factors: NA  Historical Factors: NA  Risk Reduction Factors:   Responsible for children under 63 years of age, Sense of responsibility to family, Employed, Living with another person, especially a relative, Positive social support, Positive therapeutic relationship, and Positive coping skills or  problem solving skills  Continued Clinical Symptoms:  Previous Psychiatric Diagnoses and Treatments  Cognitive Features That Contribute To Risk:  None    Suicide Risk:  Minimal: No identifiable suicidal ideation.  Patients presenting with no risk factors but with morbid ruminations; may be classified as minimal risk based on the severity of the depressive symptoms  Plan Of Care/Follow-up recommendations:  Patient reviewed with Dr. Nelly Rout. Follow-up with outpatient psychiatry, resources provided. Continue current medications including: -Olanzapine 5 mg nightly/mood -Trazodone 50 mg nightly as needed/sleep  Disposition: Discharge  1753pm- Patient unable to fill prescription for olanzapine at [facility] today- this Clinical research associate called prescription to [facility] on [address] in [place]. Olanzapine 5mg  QHS/mood #30 with no refills at patient request.  , FNP 07/21/2021, 1:18 PM

## 2021-07-24 ENCOUNTER — Other Ambulatory Visit: Payer: Self-pay

## 2021-07-28 ENCOUNTER — Other Ambulatory Visit: Payer: Self-pay

## 2021-07-31 ENCOUNTER — Other Ambulatory Visit: Payer: Self-pay

## 2021-08-15 ENCOUNTER — Telehealth (HOSPITAL_COMMUNITY): Payer: No Payment, Other | Admitting: Psychiatry

## 2021-08-15 ENCOUNTER — Encounter (HOSPITAL_COMMUNITY): Payer: Self-pay

## 2021-09-11 ENCOUNTER — Other Ambulatory Visit (HOSPITAL_COMMUNITY): Payer: Self-pay

## 2021-09-11 ENCOUNTER — Other Ambulatory Visit: Payer: Self-pay

## 2021-09-13 ENCOUNTER — Other Ambulatory Visit: Payer: Self-pay

## 2021-09-18 ENCOUNTER — Other Ambulatory Visit: Payer: Self-pay

## 2021-10-11 ENCOUNTER — Other Ambulatory Visit: Payer: Self-pay

## 2021-10-11 ENCOUNTER — Emergency Department (HOSPITAL_COMMUNITY)
Admission: EM | Admit: 2021-10-11 | Discharge: 2021-10-12 | Disposition: A | Discharge status: Home / Self Care | Payer: Self-pay | Attending: Emergency Medicine | Admitting: Emergency Medicine

## 2021-10-11 ENCOUNTER — Encounter (HOSPITAL_COMMUNITY): Payer: Self-pay

## 2021-10-11 DIAGNOSIS — L539 Erythematous condition, unspecified: Secondary | ICD-10-CM | POA: Insufficient documentation

## 2021-10-11 DIAGNOSIS — L02211 Cutaneous abscess of abdominal wall: Secondary | ICD-10-CM | POA: Insufficient documentation

## 2021-10-11 DIAGNOSIS — L0291 Cutaneous abscess, unspecified: Secondary | ICD-10-CM

## 2021-10-11 DIAGNOSIS — R000 Tachycardia, unspecified: Secondary | ICD-10-CM | POA: Insufficient documentation

## 2021-10-11 NOTE — ED Provider Triage Note (Signed)
Emergency Medicine Provider Triage Evaluation Note  Stacey Guzman , a 26 y.o. female  was evaluated in triage.  Pt complains of abscess to vagina after getting waxed. Some redness. No hx of similar. No fever, chills.  Review of Systems  Positive: abscess Negative: fever  Physical Exam  There were no vitals taken for this visit. Gen:   Awake, no distress   Resp:  Normal effort  MSK:   Moves extremities without difficulty  Other:    Medical Decision Making  Medically screening exam initiated at 9:49 PM.  Appropriate orders placed.  Stacey Guzman was informed that the remainder of the evaluation will be completed by another provider, this initial triage assessment does not replace that evaluation, and the importance of remaining in the ED until their evaluation is complete.  abscess   Stacey Guzman A, PA-C 10/11/21 2149

## 2021-10-11 NOTE — ED Triage Notes (Signed)
Pt states that she recently got a bikini wax and now has an abscess to the top of her vagina that is draining.  ?

## 2021-10-12 MED ORDER — DOXYCYCLINE HYCLATE 100 MG PO CAPS: 100.0000 mg | ORAL_CAPSULE | Freq: Two times a day (BID) | ORAL | 0 refills | Status: AC

## 2021-10-12 MED ORDER — LIDOCAINE HCL 1 % IJ SOLN
INTRAMUSCULAR | Status: AC
  Filled 2021-10-12: qty 20

## 2021-10-12 NOTE — ED Notes (Signed)
I provided reinforced discharge education based off of discharge instructions. Pt acknowledged and understood my education. Pt had no further questions/concerns for provider/myself.  °

## 2021-10-12 NOTE — ED Provider Notes (Addendum)
[facility]   CSN: [id number] Arrival date & time: 10/11/21  2112     History  Chief Complaint  Patient presents with   Abscess    Cassey Bacigalupo is a 26 y.o. female.  HPI  Without medical history presents with complaints of an abscess.  Patient states that she had her bikini line waxed and then  developed an abscess.  Abscess started about 3 days ago, states is gotten larger and more painful, states she has had some drainage, she is never had this past, she is not immunocompromise, no history of MRSA infections.  She has no other complaints.    Home Medications Prior to Admission medications   Medication Sig Start Date End Date Taking? Authorizing Provider  doxycycline (VIBRAMYCIN) 100 MG capsule Take 1 capsule (100 mg total) by mouth 2 (two) times daily for 7 days. 10/12/21 10/19/21 Yes Carroll Sage, PA-C  OLANZapine zydis (ZYPREXA) 5 MG disintegrating tablet Take 1 tablet (5 mg total) by mouth at bedtime. 07/21/21   Lenard Lance, FNP  traZODone (DESYREL) 50 MG tablet Take 1 tablet (50 mg total) by mouth at bedtime as needed for sleep. 07/21/21   Lenard Lance, FNP      Allergies    Patient has no known allergies.    Review of Systems   Review of Systems  Constitutional:  Negative for chills and fever.  Respiratory:  Negative for shortness of breath.   Cardiovascular:  Negative for chest pain.  Gastrointestinal:  Negative for abdominal pain.  Skin:  Positive for wound.  Neurological:  Negative for headaches.   Physical Exam Updated Vital Signs BP 113/69   Pulse (!) 107   Temp 99 F (37.2 C) (Oral)   Resp 15   SpO2 98%  Physical Exam Vitals and nursing note reviewed. Exam conducted with a chaperone present.  Constitutional:      General: She is not in acute distress.    Appearance: Normal appearance. She is not ill-appearing or diaphoretic.  HENT:     Head: Normocephalic and atraumatic.     Nose: No  congestion or rhinorrhea.  Eyes:     Conjunctiva/sclera: Conjunctivae normal.  Cardiovascular:     Rate and Rhythm: Tachycardia present.  Pulmonary:     Effort: Pulmonary effort is normal.  Musculoskeletal:     Cervical back: Neck supple.     Right lower leg: No edema.     Left lower leg: No edema.  Skin:    General: Skin is warm and dry.     Coloration: Skin is not jaundiced or pale.     Comments: With chaperone presents abscess was noted in the suprapubic region, measures about 3 cm in diameter, some drainage present, fluctuance and induration present.  Slight erythema noted over the area  Neurological:     Mental Status: She is alert and oriented to person, place, and time.  Psychiatric:        Mood and Affect: Mood normal.    ED Results / Procedures / Treatments   Labs (all labs ordered are listed, but only abnormal results are displayed) Labs Reviewed - No data to display  EKG None  Radiology No results found.  Procedures .Marland KitchenIncision and Drainage  Date/Time: 10/12/2021 3:03 AM Performed by: Carroll Sage, PA-C Authorized by: Carroll Sage, PA-C   Consent:    Consent obtained:  Verbal   Consent given by:  Patient   Risks discussed:  Bleeding, incomplete drainage, pain, damage to other organs and infection   Alternatives discussed:  No treatment, delayed treatment, alternative treatment, observation and referral Universal protocol:    Patient identity confirmed:  Verbally with patient Location:    Type:  Abscess   Size:  3   Location:  Trunk   Trunk location:  Abdomen Pre-procedure details:    Skin preparation:  Povidone-iodine Sedation:    Sedation type:  None Anesthesia:    Anesthesia method:  Local infiltration   Local anesthetic:  Lidocaine 1% w/o epi Procedure type:    Complexity:  Simple Procedure details:    Ultrasound guidance: no     Needle aspiration: no     Incision types:  Single straight   Incision depth:  Subcutaneous    Drainage:  Bloody and purulent   Drainage amount:  Moderate   Wound treatment:  Wound left open Post-procedure details:    Procedure completion:  Tolerated with difficulty    Medications Ordered in ED Medications  lidocaine (XYLOCAINE) 1 % (with pres) injection (  Given by Other 10/12/21 0306)    ED Course/ Medical Decision Making/ A&P                           Medical Decision Making Risk Prescription drug management.   This patient presents to the ED for concern of abscess, this involves an extensive number of treatment options, and is a complaint that carries with it a high risk of complications and morbidity.  The differential diagnosis includes cellulitis, abscess, necrotizing fasciitis    Additional history obtained:  Additional history obtained from N/A External records from outside source obtained and reviewed including medical history, immunizations, medications   Co morbidities that complicate the patient evaluation  N/A  Social Determinants of Health:  N/A    Lab Tests:  I Ordered, and personally interpreted labs.  The pertinent results include: N/A   Imaging Studies ordered:  I ordered imaging studies including N/A I independently visualized and interpreted imaging which showed N/A I agree with the radiologist interpretation   Cardiac Monitoring:  The patient was maintained on a cardiac monitor.  I personally viewed and interpreted the cardiac monitored which showed an underlying rhythm of: N/A   Medicines ordered and prescription drug management:  I ordered medication including lidocaine I have reviewed the patients home medicines and have made adjustments as needed  Critical Interventions:  N/A   Reevaluation:  Presents with a abscess, with chaperone present episodes noted in the suprapubic region, recommend I&D, patient agreed in this plan, she tolerated procedure with some difficulty.  She was tender despite using lidocaine, I  recommended that we incise the area slightly more to ensure that everything drained properly but the patient would not tolerate this.  I explained that she will need to keep an eye on it as well as this might close up before everything drains out.  She verbalized that she understands.  Consultations Obtained:  N/a    Test Considered:  CT abdomen pelvis-this was deferred my suspicion for deeper tissue infection is very low at this time, it appears to be more superficial nature.    Rule out Low suspicion for systemic infection patient nontoxic-appearing vital signs reassuring.  Low suspicion for necrotizing fasciitis presentation atypical of etiology.    Dispostion and problem list  After consideration of the diagnostic results and the patients response to treatment, I feel that the patent would benefit  from discharge.  Abscess-I&D without complication, not large enough to warrant packing, overlying cellulitis concern for cellulitis will start on antibiotics follow-up with PCP for further eval.            Final Clinical Impression(s) / ED Diagnoses Final diagnoses:  Abscess    Rx / DC Orders ED Discharge Orders          Ordered    doxycycline (VIBRAMYCIN) 100 MG capsule  2 times daily        10/12/21 0305              Carroll SageFaulkner, Cuong Moorman J, PA-C 10/12/21 0308    Carroll SageFaulkner, Bird Swetz J, PA-C 10/12/21 0313    Pollyann SavoySheldon, Charles B, MD 10/12/21 [phone]

## 2021-10-12 NOTE — ED Notes (Signed)
I provided some extra dressings for pt to use on abscess as directed in AVS.

## 2021-10-12 NOTE — Discharge Instructions (Addendum)
I recommend applying a warm compresses to the abscess and changing out the dressings 2-3 times daily as this will help bring out the infection.  May use over-the-counter pain medication as needed.  Have started on antibiotics please take as prescribed.  If you note that after 48 to 72 hours of using antibiotics the area is becoming more red more painful swollen and larger in size and was cut back in for reevaluation.  Come back to the emergency department if you develop chest pain, shortness of breath, severe abdominal pain, uncontrolled nausea, vomiting, diarrhea.

## 2021-10-17 ENCOUNTER — Other Ambulatory Visit: Payer: Self-pay

## 2021-10-17 ENCOUNTER — Telehealth (INDEPENDENT_AMBULATORY_CARE_PROVIDER_SITE_OTHER): Payer: No Payment, Other | Admitting: Psychiatry

## 2021-10-17 ENCOUNTER — Telehealth (HOSPITAL_COMMUNITY): Payer: No Payment, Other | Admitting: Physician Assistant

## 2021-10-17 ENCOUNTER — Encounter (HOSPITAL_COMMUNITY): Payer: Self-pay | Admitting: Psychiatry

## 2021-10-17 DIAGNOSIS — F203 Undifferentiated schizophrenia: Secondary | ICD-10-CM

## 2021-10-17 MED ORDER — TRAZODONE HCL 50 MG PO TABS
50.0000 mg | ORAL_TABLET | Freq: Every evening | ORAL | 3 refills | Status: AC | PRN
Start: 2021-10-17 — End: ?
  Filled 2021-10-17: qty 30, 30d supply, fill #0
  Filled 2021-12-27: qty 30, 30d supply, fill #1

## 2021-10-17 MED ORDER — OLANZAPINE 10 MG PO TBDP
10.0000 mg | ORAL_TABLET | Freq: Every day | ORAL | 3 refills | Status: DC
  Filled 2021-10-17: qty 30, 30d supply, fill #0
  Filled 2021-12-27: qty 30, 30d supply, fill #1

## 2021-10-17 NOTE — Progress Notes (Signed)
BH MD/PA/NP OP Progress Note Virtual Visit via Video Note  I connected with Stacey Guzman on 10/17/21 at  2:00 PM EDT by a video enabled telemedicine application and verified that I am speaking with the correct person using two identifiers.  Location: Patient: Home Provider: Clinic   I discussed the limitations of evaluation and management by telemedicine and the availability of in person appointments. The patient expressed understanding and agreed to proceed.  I provided 30 minutes of non-face-to-face time during this encounter.   10/17/2021 2:23 PM Stacey Guzman  MRN:  [id number]  Chief Complaint: " The voices are quieter but I feel like Zyprexa should be increased a little"  HPI: 26 year old female seen today for follow up psychiatric evaluation.  She has a psychiatric history of schizophrenia.  She is currently managed on Paxil 5 mg daily and trazodone 50 mg nightly.  She notes that her medications are somewhat effective in managing her psychiatric conditions.   Today she is well-groomed, pleasant, cooperative, engaged in conversation, and maintained eye contact.  She informed provider that since starting Zyprexa her voices are quieter.  She informed Clinical research associatewriter that she is uncertain what they are saying but reports that she feels that Zyprexa should be increased to help alleviate her auditory hallucinations.  She denies visual hallucinations/SI/HI or paranoia.  Patient endorses adequate sleep and appetite.  She informed Clinical research associatewriter that she has been working more as a LawyerCNA and has lost over 30 pounds by being more active.  Today patient denies symptoms of anxiety and depression and scored a 0 on her GAD-7 and PHQ-9.  Today patient agreeable to increasing Zyprexa 5 mg to 10 mg to help manage symptoms of auditory hallucinations.  She will continue trazodone as prescribed.  No other concerns noted at this time. 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 OLANZapine  zydis (ZYPREXA) 10 MG disintegrating tablet    traZODone (DESYREL) 50 MG tablet      Past Psychiatric History: Schizophrenia  Past Medical History:  Past Medical History:  Diagnosis Date   Medical history non-contributory    Schizophrenia (HCC)     Past Surgical History:  Procedure Laterality Date   ADENOIDECTOMY     CESAREAN SECTION N/A 03/26/2016   Procedure: PRIMARY CESAREAN SECTION;  Surgeon: Jaymes GraffNaima Dillard, MD;  Location: [facility];  Service: Obstetrics;  Laterality: N/A;  Provider requests RNFA -ap   TONSILLECTOMY      Family Psychiatric History: Maternal grandmother substance use, maternal cousin and uncle with schizophrenia and grandmother with schizophrenia or bipolar  Family History:  Family History  Problem Relation Age of Onset   Hypertension Maternal Grandfather    Hypertension Paternal Grandfather    Diabetes Mother    Hypertension Mother    Asthma Father    Hypertension Father     Social History:  Social History   Socioeconomic History   Marital status: Single    Spouse name: Not on file   Number of children: Not on file   Years of education: Not on file   Highest education level: Not on file  Occupational History   Not on file  Tobacco Use   Smoking status: Never   Smokeless tobacco: Never  Vaping Use   Vaping Use: Never used  Substance and Sexual Activity   Alcohol use: Yes   Drug use: Yes    Types: Marijuana   Sexual activity: Yes    Birth control/protection: None  Other Topics  Concern   Not on file  Social History Narrative   Not on file   Social Determinants of Health   Financial Resource Strain: Not on file  Food Insecurity: Not on file  Transportation Needs: Not on file  Physical Activity: Not on file  Stress: Not on file  Social Connections: Not on file    Allergies: No Known Allergies  Metabolic Disorder Labs: Lab Results  Component Value Date   HGBA1C 5.4 07/20/2021   MPG 108 07/20/2021   MPG 116.89  08/17/2020   Lab Results  Component Value Date   PROLACTIN 11.4 07/20/2021   PROLACTIN 337.0 (H) 01/05/2021   Lab Results  Component Value Date   CHOL 113 07/20/2021   TRIG 23 07/20/2021   HDL 43 07/20/2021   CHOLHDL 2.6 07/20/2021   VLDL 5 07/20/2021   LDLCALC 65 07/20/2021   LDLCALC 60 08/17/2020   Lab Results  Component Value Date   TSH 1.522 07/20/2021   TSH 2.874 08/17/2020    Therapeutic Level Labs: No results found for: LITHIUM No results found for: VALPROATE No components found for:  CBMZ  Current Medications: Current Outpatient Medications  Medication Sig Dispense Refill   doxycycline (VIBRAMYCIN) 100 MG capsule Take 1 capsule (100 mg total) by mouth 2 (two) times daily for 7 days. 14 capsule 0   OLANZapine zydis (ZYPREXA) 10 MG disintegrating tablet Take 1 tablet (10 mg total) by mouth at bedtime. 30 tablet 3   traZODone (DESYREL) 50 MG tablet Take 1 tablet (50 mg total) by mouth at bedtime as needed for sleep. 30 tablet 3   No current facility-administered medications for this visit.     Musculoskeletal: Strength & Muscle Tone: within normal limits and telehealth visit Gait & Station: normal, telehealth visit Patient leans: N/A  Psychiatric Specialty Exam: Review of Systems  unknown if currently breastfeeding.There is no height or weight on file to calculate BMI.  General Appearance: Well Groomed  Eye Contact:  Good  Speech:  Clear and Coherent and Normal Rate  Volume:  Normal  Mood:  Euthymic  Affect:  Appropriate and Congruent  Thought Process:  Coherent, Goal Directed, and Linear  Orientation:  Full (Time, Place, and Person)  Thought Content: WDL and Hallucinations: Auditory   Suicidal Thoughts:  No  Homicidal Thoughts:  No  Memory:  Immediate;   Good Recent;   Good Remote;   Good  Judgement:  Good  Insight:  Good  Psychomotor Activity:  Normal  Concentration:  Concentration: Good and Attention Span: Good  Recall:  Good  Fund of  Knowledge: Good  Language: Good  Akathisia:  No  Handed:  Right  AIMS (if indicated): not done  Assets:  Communication Skills Desire for Improvement Financial Resources/Insurance Housing Physical Health Social Support  ADL's:  Intact  Cognition: WNL  Sleep:  Good   Screenings: GAD-7    Flowsheet Row Video Visit from 10/17/2021 in [facility] Video Visit from 11/14/2020 in [facility] Clinical Support from 08/22/2020 in [facility]  Total GAD-7 Score 0 0 4      PHQ2-9    Flowsheet Row Video Visit from 10/17/2021 in [facility] Video Visit from 11/14/2020 in [facility] Clinical Support from 08/22/2020 in [facility]  PHQ-2 Total Score 0 0 3  PHQ-9 Total Score 0 1 11      Flowsheet Row Video Visit from 10/17/2021  in [facility] ED from 10/11/2021 in [facility] from 07/20/2021 in [facility]  C-SSRS RISK CATEGORY No Risk No Risk No Risk        Assessment and Plan: Patient endorses auditory hallucinations.  She notes that her anxiety and depression are well managed.  Today she is agreeable to increasing Zyprexa 5 mg to 10 mg to help manage hallucinations.  She will continue her other medications as prescribed. 1. Undifferentiated schizophrenia (HCC)  Increase- OLANZapine zydis (ZYPREXA) 10 MG disintegrating tablet; Take 1 tablet (10 mg total) by mouth at bedtime.  Dispense: 30 tablet; Refill: 3 Continue- traZODone (DESYREL) 50 MG tablet; Take 1 tablet (50 mg total) by mouth at bedtime as needed for sleep.  Dispense: 30 tablet; Refill: 3   Collaboration of Care: Collaboration of Care: Other provider involved in patient's care AEB    Patient/Guardian was advised Release of Information must be obtained prior to any  record release in order to collaborate their care with an outside provider. Patient/Guardian was advised if they have not already done so to contact the registration department to sign all necessary forms in order for [place] to release information regarding their care.   Consent: Patient/Guardian gives verbal consent for treatment and assignment of benefits for services provided during this visit. Patient/Guardian expressed understanding and agreed to proceed.   Follow-up in 33-month Shanna Cisco, NP 10/17/2021, 2:23 PM

## 2021-10-18 ENCOUNTER — Other Ambulatory Visit: Payer: Self-pay

## 2021-11-25 ENCOUNTER — Emergency Department (HOSPITAL_COMMUNITY)
Admission: EM | Admit: 2021-11-25 | Discharge: 2021-11-25 | Disposition: A | Discharge status: Home / Self Care | Payer: Self-pay | Attending: Emergency Medicine | Admitting: Emergency Medicine

## 2021-11-25 ENCOUNTER — Other Ambulatory Visit: Payer: Self-pay

## 2021-11-25 ENCOUNTER — Emergency Department (HOSPITAL_COMMUNITY): Payer: Self-pay

## 2021-11-25 ENCOUNTER — Encounter (HOSPITAL_COMMUNITY): Payer: Self-pay

## 2021-11-25 DIAGNOSIS — R079 Chest pain, unspecified: Secondary | ICD-10-CM | POA: Insufficient documentation

## 2021-11-25 DIAGNOSIS — T07XXXA Unspecified multiple injuries, initial encounter: Secondary | ICD-10-CM

## 2021-11-25 DIAGNOSIS — S02652A Fracture of angle of left mandible, initial encounter for closed fracture: Secondary | ICD-10-CM | POA: Insufficient documentation

## 2021-11-25 DIAGNOSIS — S40012A Contusion of left shoulder, initial encounter: Secondary | ICD-10-CM | POA: Insufficient documentation

## 2021-11-25 LAB — CBC
HCT: 40 % (ref 36.0–46.0)
Hemoglobin: 12.9 g/dL (ref 12.0–15.0)
MCH: 27.8 pg (ref 26.0–34.0)
MCHC: 32.3 g/dL (ref 30.0–36.0)
MCV: 86.2 fL (ref 80.0–100.0)
Platelets: 290 10*3/uL (ref 150–400)
RBC: 4.64 MIL/uL (ref 3.87–5.11)
RDW: 15.2 % (ref 11.5–15.5)
WBC: 10.3 10*3/uL (ref 4.0–10.5)
nRBC: 0 % (ref 0.0–0.2)

## 2021-11-25 LAB — I-STAT BETA HCG BLOOD, ED (MC, WL, AP ONLY): I-stat hCG, quantitative: <5 m[IU]/mL (ref <5)

## 2021-11-25 LAB — BASIC METABOLIC PANEL
Anion gap: 9 (ref 5–15)
BUN: 12 mg/dL (ref 6–20)
CO2: 27 mmol/L (ref 22–32)
Calcium: 9.5 mg/dL (ref 8.9–10.3)
Chloride: 106 mmol/L (ref 98–111)
Creatinine, Ser: 1.02 mg/dL — ABNORMAL HIGH (ref 0.44–1.00)
GFR, Estimated: >60 mL/min (ref >60)
Glucose, Bld: 89 mg/dL (ref 70–99)
Potassium: 4.1 mmol/L (ref 3.5–5.1)
Sodium: 142 mmol/L (ref 135–145)

## 2021-11-25 MED ORDER — IOHEXOL 350 MG/ML SOLN
75.0000 mL | Freq: Once | INTRAVENOUS | Status: AC | PRN
  Administered 2021-11-25: 75 mL via INTRAVENOUS

## 2021-11-25 MED ORDER — ONDANSETRON 8 MG PO TBDP: 8.0000 mg | ORAL_TABLET | Freq: Once | ORAL | Status: DC

## 2021-11-25 MED ORDER — HYDROCODONE-ACETAMINOPHEN 7.5-325 MG/15ML PO SOLN: 15.0000 mL | Freq: Four times a day (QID) | ORAL | 0 refills | Status: AC | PRN

## 2021-11-25 MED ORDER — HYDROMORPHONE HCL 1 MG/ML IJ SOLN: 1.0000 mg | Freq: Once | INTRAMUSCULAR | Status: DC

## 2021-11-25 MED ORDER — ONDANSETRON HCL 4 MG/2ML IJ SOLN
4.0000 mg | Freq: Once | INTRAMUSCULAR | Status: AC
  Administered 2021-11-25: 4 mg via INTRAVENOUS
  Filled 2021-11-25: qty 2

## 2021-11-25 MED ORDER — AMOXICILLIN 250 MG/5ML PO SUSR: 500.0000 mg | Freq: Three times a day (TID) | ORAL | 0 refills | Status: AC

## 2021-11-25 MED ORDER — AMOXICILLIN 250 MG/5ML PO SUSR
500.0000 mg | Freq: Once | ORAL | Status: AC
  Administered 2021-11-25: 500 mg via ORAL
  Filled 2021-11-25: qty 10

## 2021-11-25 MED ORDER — HYDROMORPHONE HCL 1 MG/ML IJ SOLN
1.0000 mg | Freq: Once | INTRAMUSCULAR | Status: AC
  Administered 2021-11-25: 1 mg via INTRAVENOUS
  Filled 2021-11-25: qty 1
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
## 2021-11-25 NOTE — ED Notes (Signed)
Blood draw x2 no access

## 2021-12-03 ENCOUNTER — Encounter (HOSPITAL_COMMUNITY): Payer: Self-pay

## 2021-12-03 ENCOUNTER — Other Ambulatory Visit: Payer: Self-pay

## 2021-12-03 ENCOUNTER — Emergency Department (HOSPITAL_COMMUNITY)
Admission: EM | Admit: 2021-12-03 | Discharge: 2021-12-03 | Disposition: A | Discharge status: Home / Self Care | Payer: Self-pay | Attending: Emergency Medicine | Admitting: Emergency Medicine

## 2021-12-03 DIAGNOSIS — S02609A Fracture of mandible, unspecified, initial encounter for closed fracture: Secondary | ICD-10-CM

## 2021-12-03 DIAGNOSIS — S02600A Fracture of unspecified part of body of mandible, initial encounter for closed fracture: Secondary | ICD-10-CM | POA: Insufficient documentation

## 2021-12-03 NOTE — Discharge Instructions (Signed)
Please follow up with doctor Ross Marcus as soon as possible
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
## 2021-12-04 ENCOUNTER — Ambulatory Visit (HOSPITAL_COMMUNITY): Payer: No Payment, Other | Admitting: Clinical

## 2021-12-27 ENCOUNTER — Other Ambulatory Visit: Payer: Self-pay

## 2022-01-17 ENCOUNTER — Telehealth (HOSPITAL_COMMUNITY): Payer: No Payment, Other | Admitting: Psychiatry

## 2022-02-05 ENCOUNTER — Other Ambulatory Visit (HOSPITAL_COMMUNITY): Payer: Self-pay | Admitting: Psychiatry

## 2022-02-05 ENCOUNTER — Telehealth (HOSPITAL_COMMUNITY): Payer: Self-pay

## 2022-02-05 ENCOUNTER — Other Ambulatory Visit: Payer: Self-pay

## 2022-02-05 DIAGNOSIS — F203 Undifferentiated schizophrenia: Secondary | ICD-10-CM

## 2022-02-05 MED ORDER — OLANZAPINE 20 MG PO TBDP
20.0000 mg | ORAL_TABLET | Freq: Every day | ORAL | 3 refills | Status: AC
Start: 2022-02-05 — End: ?
  Filled 2022-02-05: qty 30, 30d supply, fill #0
  Filled 2022-03-06 – 2022-04-02 (×2): qty 30, 30d supply, fill #1

## 2022-02-05 NOTE — Telephone Encounter (Signed)
Pt called asking to speak with Dr. Doyne Keel about her medication changes. She has questions about the medication.

## 2022-02-05 NOTE — Telephone Encounter (Signed)
Patient informed Clinical research associate that she would like to restart Risperdal.  Risperdal was discontinued she elevated prolactin levels and leakage of breast.  Provider recommended that Risperdal not be restarted at this time.  She endorsed understanding and agreed.  Patient informed Clinical research associate that she continues to have auditory hallucinations.  She notes that she hears 3 voices talking at once.  She also notes that her sleep fluctuates.  She reports that her anxiety and depression are minimal.  Today provider agreeable to increasing Zyprexa 10 mg to 20 mg to help manage symptoms of psychosis and sleep.  She will continue all other medications as prescribed.  No other concerns noted at this time.

## 2022-02-19 ENCOUNTER — Encounter (HOSPITAL_COMMUNITY): Payer: Self-pay | Admitting: Psychiatry

## 2022-03-06 ENCOUNTER — Other Ambulatory Visit: Payer: Self-pay

## 2022-03-07 ENCOUNTER — Other Ambulatory Visit: Payer: Self-pay

## 2022-03-08 ENCOUNTER — Other Ambulatory Visit: Payer: Self-pay

## 2022-03-15 ENCOUNTER — Other Ambulatory Visit: Payer: Self-pay

## 2022-04-02 ENCOUNTER — Other Ambulatory Visit: Payer: Self-pay

## 2022-04-17 ENCOUNTER — Other Ambulatory Visit: Payer: Self-pay

## 2022-04-17 ENCOUNTER — Emergency Department (HOSPITAL_COMMUNITY): Payer: No Typology Code available for payment source

## 2022-04-17 ENCOUNTER — Emergency Department (HOSPITAL_COMMUNITY)
Admission: EM | Admit: 2022-04-17 | Discharge: 2022-04-17 | Disposition: A | Discharge status: Home / Self Care | Payer: No Typology Code available for payment source | Attending: Student | Admitting: Student

## 2022-04-17 ENCOUNTER — Encounter (HOSPITAL_COMMUNITY): Payer: Self-pay

## 2022-04-17 DIAGNOSIS — Y92410 Unspecified street and highway as the place of occurrence of the external cause: Secondary | ICD-10-CM | POA: Diagnosis not present

## 2022-04-17 DIAGNOSIS — S50312A Abrasion of left elbow, initial encounter: Secondary | ICD-10-CM | POA: Insufficient documentation

## 2022-04-17 DIAGNOSIS — Z23 Encounter for immunization: Secondary | ICD-10-CM | POA: Insufficient documentation

## 2022-04-17 DIAGNOSIS — S161XXA Strain of muscle, fascia and tendon at neck level, initial encounter: Secondary | ICD-10-CM | POA: Diagnosis not present

## 2022-04-17 DIAGNOSIS — S80812A Abrasion, left lower leg, initial encounter: Secondary | ICD-10-CM | POA: Diagnosis not present

## 2022-04-17 DIAGNOSIS — M25522 Pain in left elbow: Secondary | ICD-10-CM

## 2022-04-17 DIAGNOSIS — S80811A Abrasion, right lower leg, initial encounter: Secondary | ICD-10-CM | POA: Diagnosis not present

## 2022-04-17 DIAGNOSIS — S199XXA Unspecified injury of neck, initial encounter: Secondary | ICD-10-CM | POA: Diagnosis present

## 2022-04-17 DIAGNOSIS — T148XXA Other injury of unspecified body region, initial encounter: Secondary | ICD-10-CM

## 2022-04-17 MED ORDER — TETANUS-DIPHTH-ACELL PERTUSSIS 5-2.5-18.5 LF-MCG/0.5 IM SUSY
PREFILLED_SYRINGE | INTRAMUSCULAR | Status: AC
  Filled 2022-04-17: qty 0.5

## 2022-04-17 MED ORDER — METHOCARBAMOL 500 MG PO TABS: 500.0000 mg | ORAL_TABLET | Freq: Two times a day (BID) | ORAL | 0 refills | Status: AC

## 2022-04-17 MED ORDER — IBUPROFEN 200 MG PO TABS
600.0000 mg | ORAL_TABLET | Freq: Once | ORAL | Status: AC
  Administered 2022-04-17: 600 mg via ORAL

## 2022-04-17 MED ORDER — IBUPROFEN 200 MG PO TABS
ORAL_TABLET | ORAL | Status: DC
Start: 2022-04-17 — End: 2022-04-18
  Filled 2022-04-17: qty 3

## 2022-04-17 MED ORDER — TETANUS-DIPHTH-ACELL PERTUSSIS 5-2.5-18.5 LF-MCG/0.5 IM SUSY
0.5000 mL | PREFILLED_SYRINGE | Freq: Once | INTRAMUSCULAR | Status: AC
  Administered 2022-04-17: 0.5 mL via INTRAMUSCULAR

## 2022-04-17 MED ORDER — ACETAMINOPHEN 325 MG PO TABS
650.0000 mg | ORAL_TABLET | Freq: Once | ORAL | Status: AC
Start: 2022-04-17 — End: 2022-04-17
  Administered 2022-04-17: 650 mg via ORAL

## 2022-04-17 MED ORDER — ACETAMINOPHEN 325 MG PO TABS
ORAL_TABLET | ORAL | Status: AC
  Filled 2022-04-17: qty 2
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
## 2023-04-11 ENCOUNTER — Ambulatory Visit (INDEPENDENT_AMBULATORY_CARE_PROVIDER_SITE_OTHER): Payer: Medicaid Other | Admitting: Mental Health

## 2023-04-11 DIAGNOSIS — F203 Undifferentiated schizophrenia: Secondary | ICD-10-CM

## 2023-04-11 DIAGNOSIS — F411 Generalized anxiety disorder: Secondary | ICD-10-CM | POA: Insufficient documentation

## 2023-04-11 DIAGNOSIS — F321 Major depressive disorder, single episode, moderate: Secondary | ICD-10-CM | POA: Insufficient documentation
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
## 2023-04-12 ENCOUNTER — Ambulatory Visit (INDEPENDENT_AMBULATORY_CARE_PROVIDER_SITE_OTHER): Payer: No Payment, Other | Admitting: Physician Assistant

## 2023-04-12 ENCOUNTER — Other Ambulatory Visit: Payer: Self-pay

## 2023-04-12 DIAGNOSIS — F203 Undifferentiated schizophrenia: Secondary | ICD-10-CM | POA: Diagnosis not present

## 2023-04-12 DIAGNOSIS — R41840 Attention and concentration deficit: Secondary | ICD-10-CM | POA: Diagnosis not present

## 2023-04-12 DIAGNOSIS — F321 Major depressive disorder, single episode, moderate: Secondary | ICD-10-CM | POA: Diagnosis not present

## 2023-04-12 DIAGNOSIS — F411 Generalized anxiety disorder: Secondary | ICD-10-CM

## 2023-04-12 MED ORDER — ATOMOXETINE HCL 40 MG PO CAPS
40.0000 mg | ORAL_CAPSULE | Freq: Every day | ORAL | 1 refills | Status: DC
  Filled 2023-04-12: qty 30, 30d supply, fill #0
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
## 2023-04-15 ENCOUNTER — Encounter (HOSPITAL_COMMUNITY): Payer: Self-pay | Admitting: Physician Assistant

## 2023-05-08 ENCOUNTER — Other Ambulatory Visit: Payer: Self-pay

## 2023-05-08 ENCOUNTER — Encounter (HOSPITAL_COMMUNITY): Payer: Self-pay | Admitting: Physician Assistant

## 2023-05-08 ENCOUNTER — Telehealth (HOSPITAL_COMMUNITY): Payer: No Payment, Other | Admitting: Physician Assistant

## 2023-05-08 DIAGNOSIS — F411 Generalized anxiety disorder: Secondary | ICD-10-CM | POA: Diagnosis not present

## 2023-05-08 DIAGNOSIS — R41840 Attention and concentration deficit: Secondary | ICD-10-CM

## 2023-05-08 DIAGNOSIS — F321 Major depressive disorder, single episode, moderate: Secondary | ICD-10-CM | POA: Diagnosis not present

## 2023-05-08 MED ORDER — ATOMOXETINE HCL 40 MG PO CAPS
40.0000 mg | ORAL_CAPSULE | Freq: Every day | ORAL | 1 refills | Status: AC
  Filled 2023-05-08 – 2023-06-18 (×2): qty 30, 30d supply, fill #0
  Filled 2023-08-22: qty 30, 30d supply, fill #1

## 2023-05-08 NOTE — Progress Notes (Signed)
BH MD/PA/NP OP Progress Note  05/08/2023 6:44 PM Stacey Guzman  MRN:  [id number]  Chief Complaint:  Chief Complaint  Patient presents with   Medication Refill   Follow-up   HPI:   Stacey Guzman is a 27 year old female with a past psychiatric history significant for generalized anxiety disorder, major depressive disorder, and attention/concentration deficit who presents to [facility] via virtual video visit for follow-up and medication management.  Patient is currently being managed on the following psychiatric medication: Atomoxetine 40 mg daily.  Patient reports that her use of atomoxetine has been going well.  She reports that she feels she can focus better.  She also reports feeling more alert.  Prior to taking atomoxetine, patient reports that she felt scatterbrained, but now she can focus more.  As far as side effects go, patient reports that she occasionally experiences floaters that may or may not be caused by atomoxetine.  She also reports that when she bends down for long periods of time, she experiences seeing black spots in her field of vision upon getting up.  Patient denies depression and reports being busy all the time.  Patient denies anxiety but reports that she does experience panicking when being late for work.  Since being placed on atomoxetine, patient has not exhibited any psychotic symptoms.  While being seen by her previous psychiatric provider, patient was given a diagnosis of undifferentiated schizophrenia.  Since her last encounter, patient denies exhibiting delusional thoughts or paranoia.  She also denies auditory or visual hallucinations and does not appear to be responding to internal/external stimuli.  A GAD-7 screen was performed with the patient scoring a 1.  Patient is alert and oriented x 4, calm, cooperative, and fully engaged in conversation during the encounter.  Patient endorses normal mood stating that she is happy  today.  Patient denies suicidal or homicidal ideations.  She further denies auditory or visual hallucinations and does not appear to be responding to internal/external stimuli.  Patient endorses good sleep and receives on average 6 to 8 hours of sleep per night.  Patient endorses a good appetite and eats on average 2-3 meals per day.  Patient denies alcohol consumption, tobacco use, or illicit drug use.  Visit Diagnosis:    ICD-10-CM   1. Attention and concentration deficit  R41.840 atomoxetine (STRATTERA) 40 MG capsule    2. Generalized anxiety disorder  F41.1     3. Episode of moderate major depression (HCC)  F32.1       Past Psychiatric History:  Patient has previously been seen by this service -- Patient has been diagnosed with in the past undifferentiated schizophrenia   Patient endorses a past history of hospitalization due to mental health.  She reports about her hospitalization occurred in 2021 in [place] due to experiencing hallucinations and crying spells.  Patient reports that she also experienced hallucinations in 2022   Patient denies a past history of suicide attempt   Patient denies a past history of homicide attempt  Past Medical History:  Past Medical History:  Diagnosis Date   Medical history non-contributory    Schizophrenia (HCC)     Past Surgical History:  Procedure Laterality Date   ADENOIDECTOMY     CESAREAN SECTION N/A 03/26/2016   Procedure: PRIMARY CESAREAN SECTION;  Surgeon: Jaymes Graff, MD;  Location: [facility];  Service: Obstetrics;  Laterality: N/A;  Provider requests RNFA -ap   TONSILLECTOMY      Family Psychiatric History:  She  denies a family history of psychiatric illness   Family history of suicide attempt: Patient denies Family history of homicide attempt: Patient denies Family history of substance abuse: Patient reports that her grandmother (maternal) abused marijuana  Family History:  Family History  Problem Relation Age  of Onset   Hypertension Maternal Grandfather    Hypertension Paternal Grandfather    Diabetes Mother    Hypertension Mother    Asthma Father    Hypertension Father     Social History:  Social History   Socioeconomic History   Marital status: Single    Spouse name: Not on file   Number of children: Not on file   Years of education: Not on file   Highest education level: Not on file  Occupational History   Not on file  Tobacco Use   Smoking status: Never   Smokeless tobacco: Never  Vaping Use   Vaping status: Never Used  Substance and Sexual Activity   Alcohol use: Yes   Drug use: Yes    Types: Marijuana   Sexual activity: Yes    Birth control/protection: None  Other Topics Concern   Not on file  Social History Narrative   Not on file   Social Determinants of Health   Financial Resource Strain: High Risk (04/11/2023)   Overall Financial Resource Strain (CARDIA)    Difficulty of Paying Living Expenses: Hard  Food Insecurity: No Food Insecurity (04/11/2023)   Hunger Vital Sign    Worried About Running Out of Food in the Last Year: Never true    Ran Out of Food in the Last Year: Never true  Transportation Needs: No Transportation Needs (04/11/2023)   PRAPARE - Administrator, Civil Service (Medical): No    Lack of Transportation (Non-Medical): No  Physical Activity: Inactive (04/11/2023)   Exercise Vital Sign    Days of Exercise per Week: 0 days    Minutes of Exercise per Session: 0 min  Stress: Stress Concern Present (04/11/2023)   [employer] of Occupational Health - Occupational Stress Questionnaire    Feeling of Stress : To some extent  Social Connections: Socially Isolated (04/11/2023)   Social Connection and Isolation Panel [NHANES]    Frequency of Communication with Friends and Family: Twice a week    Frequency of Social Gatherings with Friends and Family: Twice a week    Attends Religious Services: Never    Database administrator or  Organizations: No    Attends Engineer, structural: Never    Marital Status: Never married    Allergies: No Known Allergies  Metabolic Disorder Labs: Lab Results  Component Value Date   HGBA1C 5.4 07/20/2021   MPG 108 07/20/2021   MPG 116.89 08/17/2020   Lab Results  Component Value Date   PROLACTIN 11.4 07/20/2021   PROLACTIN 337.0 (H) 01/05/2021   Lab Results  Component Value Date   CHOL 113 07/20/2021   TRIG 23 07/20/2021   HDL 43 07/20/2021   CHOLHDL 2.6 07/20/2021   VLDL 5 07/20/2021   LDLCALC 65 07/20/2021   LDLCALC 60 08/17/2020   Lab Results  Component Value Date   TSH 1.522 07/20/2021   TSH 2.874 08/17/2020    Therapeutic Level Labs: No results found for: "LITHIUM" No results found for: "VALPROATE" No results found for: "CBMZ"  Current Medications: Current Outpatient Medications  Medication Sig Dispense Refill   amoxicillin (AMOXIL) 250 MG/5ML suspension Take 10 mLs (500 mg total) by mouth 3 (three)  times daily. 210 mL 0   atomoxetine (STRATTERA) 40 MG capsule Take 1 capsule (40 mg total) by mouth daily. 30 capsule 1   methocarbamol (ROBAXIN) 500 MG tablet Take 1 tablet (500 mg total) by mouth 2 (two) times daily. 20 tablet 0   OLANZapine zydis (ZYPREXA) 20 MG disintegrating tablet Take 1 tablet (20 mg total) by mouth at bedtime. 30 tablet 3   traZODone (DESYREL) 50 MG tablet Take 1 tablet (50 mg total) by mouth at bedtime as needed for sleep. 30 tablet 3   No current facility-administered medications for this visit.     Musculoskeletal: Strength & Muscle Tone: within normal limits Gait & Station: normal Patient leans: N/A  Psychiatric Specialty Exam: Review of Systems  Psychiatric/Behavioral:  Negative for decreased concentration, dysphoric mood, hallucinations, self-injury, sleep disturbance and suicidal ideas. The patient is not nervous/anxious and is not hyperactive.     unknown if currently breastfeeding.There is no height or weight  on file to calculate BMI.  General Appearance: Casual  Eye Contact:  Good  Speech:  Clear and Coherent and Normal Rate  Volume:  Normal  Mood:  Euthymic  Affect:  Appropriate  Thought Process:  Coherent  Orientation:  Full (Time, Place, and Person)  Thought Content: WDL   Suicidal Thoughts:  No  Homicidal Thoughts:  No  Memory:  Immediate;   Good Recent;   Good Remote;   Good  Judgement:  Good  Insight:  Good  Psychomotor Activity:  Normal  Concentration:  Concentration: Good and Attention Span: Good  Recall:  Good  Fund of Knowledge: Good  Language: Good  Akathisia:  No  Handed:  Right  AIMS (if indicated): not done  Assets:  Communication Skills Desire for Improvement Housing Social Support Transportation  ADL's:  Intact  Cognition: WNL  Sleep:  Good   Screenings: GAD-7    Flowsheet Row Video Visit from 05/08/2023 in [facility] Office Visit from 04/12/2023 in [facility] Counselor from 04/11/2023 in [facility] Video Visit from 10/17/2021 in [facility] Video Visit from 11/14/2020 in [facility]  Total GAD-7 Score 1 11 16  0 0      PHQ2-9    Flowsheet Row Video Visit from 05/08/2023 in [facility] Office Visit from 04/12/2023 in [facility] Counselor from 04/11/2023 in [facility] Video Visit from 10/17/2021 in [facility] Video Visit from 11/14/2020 in [facility]  PHQ-2 Total Score 0 2 4 0 0  PHQ-9 Total Score -- 11 18 0 1      Flowsheet Row Video Visit from 05/08/2023 in [facility] Office Visit from 04/12/2023 in [facility] Counselor from 04/11/2023 in [facility]  C-SSRS RISK CATEGORY No Risk  No Risk No Risk        Assessment and Plan:  Stacey Guzman is a 28 year old female with a past psychiatric history significant for generalized anxiety disorder, major depressive disorder, and attention/concentration deficit who presents to [facility] via virtual video visit for follow-up and medication management.  Patient presents today encounter reporting no issues or concerns to her use of atomoxetine.  Since being placed on the medication, patient denies experiencing instances of psychosis.  Patient denies auditory or visual hallucinations and does not appear to be responding  to internal/external stimuli.  Since using atomoxetine, patient reports that her concentration has improved and she does not feel scatterbrained.  Patient does endorse experiencing floaters and black spots in her field of vision when bending down for long periods of time that she believes may be attributed to her use of atomoxetine.  Despite experiencing these symptoms, patient would like to continue taking her atomoxetine as prescribed.  Patient's medication to be e-prescribed to pharmacy of choice.  Provider to continue to monitor patient for any signs of psychosis.  Collaboration of Care: Collaboration of Care: Medication Management AEB provider managing patient's psychiatric medications and Psychiatrist AEB patient being followed by mental health provider at this facility  Patient/Guardian was advised Release of Information must be obtained prior to any record release in order to collaborate their care with an outside provider. Patient/Guardian was advised if they have not already done so to contact the registration department to sign all necessary forms in order for [place] to release information regarding their care.   Consent: Patient/Guardian gives verbal consent for treatment and assignment of benefits for services provided during this visit. Patient/Guardian expressed  understanding and agreed to proceed.   1. Attention and concentration deficit Patient denies experiencing any adverse side effects since being placed on atomoxetine - no presence of psychosis since starting medication - patient will continue to be monitored routinely - patient has been on the following psychiatric medications in the past: risperidone, caplyta, Abilify, Invega, and Zyprexa - patient has also been on Paxil and trazodone  - atomoxetine (STRATTERA) 40 MG capsule; Take 1 capsule (40 mg total) by mouth daily.  Dispense: 30 capsule; Refill: 1  2. Generalized anxiety disorder  3. Episode of moderate major depression (HCC) Rule out undifferentiated schizophrenia - patient has previous diagnosis - patient denies auditory or visual hallucinations and does not appear to be responding to internal stimuli - patient denies delusions  Patient to follow up in 6 weeks Provider spent a total of 23 minutes with the patient/reviewing patient's chart  Meta Hatchet, PA 05/08/2023, 6:44 PM

## 2023-05-20 ENCOUNTER — Other Ambulatory Visit: Payer: Self-pay

## 2023-05-28 ENCOUNTER — Telehealth (HOSPITAL_COMMUNITY): Payer: Self-pay | Admitting: *Deleted

## 2023-05-28 NOTE — Telephone Encounter (Signed)
Patient called asking that her provider call to discuss her medications. Her mind is not clear and she is needing another medication to help. She is afraid she will have issues when she returns to school.

## 2023-06-03 NOTE — Telephone Encounter (Signed)
 I will have her provider follow-up with her as recently he changed her medications.

## 2023-06-18 ENCOUNTER — Telehealth (HOSPITAL_COMMUNITY): Payer: No Payment, Other | Admitting: Physician Assistant

## 2023-06-18 ENCOUNTER — Other Ambulatory Visit: Payer: Self-pay

## 2023-06-18 ENCOUNTER — Encounter (HOSPITAL_COMMUNITY): Payer: Self-pay

## 2023-08-22 ENCOUNTER — Other Ambulatory Visit: Payer: Self-pay

## 2023-11-13 ENCOUNTER — Other Ambulatory Visit: Payer: Self-pay
